# Patient Record
Sex: Male | Born: 1980 | Race: Black or African American | Hispanic: No | Marital: Married | Smoking: Current every day smoker
Health system: Southern US, Community
[De-identification: ages and names within clinical notes are randomized; demographics above are authoritative.]

## PROBLEM LIST (undated history)

## (undated) DIAGNOSIS — K219 Gastro-esophageal reflux disease without esophagitis: Secondary | ICD-10-CM

## (undated) DIAGNOSIS — I10 Essential (primary) hypertension: Secondary | ICD-10-CM

## (undated) HISTORY — PX: APPENDECTOMY: SHX54

---
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
## 1999-06-15 ENCOUNTER — Emergency Department (HOSPITAL_COMMUNITY): Admission: EM | Admit: 1999-06-15 | Discharge: 1999-06-15 | Payer: Self-pay | Admitting: Emergency Medicine

## 2011-09-06 ENCOUNTER — Encounter (HOSPITAL_COMMUNITY): Payer: Self-pay | Admitting: *Deleted

## 2011-09-06 ENCOUNTER — Emergency Department (HOSPITAL_COMMUNITY)
Admission: EM | Admit: 2011-09-06 | Discharge: 2011-09-06 | Discharge status: Against Medical Advice | Payer: 59 | Attending: Emergency Medicine | Admitting: Emergency Medicine

## 2011-09-06 DIAGNOSIS — R110 Nausea: Secondary | ICD-10-CM | POA: Insufficient documentation

## 2011-09-06 DIAGNOSIS — R109 Unspecified abdominal pain: Secondary | ICD-10-CM | POA: Insufficient documentation

## 2011-09-06 HISTORY — DX: Gastro-esophageal reflux disease without esophagitis: K21.9

## 2011-09-06 NOTE — ED Notes (Signed)
Pt in c/o left flank pain with nausea since Sunday, pain increased today

## 2011-09-06 NOTE — ED Notes (Signed)
Pt called x 2, outside checked, no answer.

## 2011-11-21 ENCOUNTER — Emergency Department (HOSPITAL_COMMUNITY): Payer: 59

## 2011-11-21 ENCOUNTER — Encounter (HOSPITAL_COMMUNITY): Payer: Self-pay | Admitting: Emergency Medicine

## 2011-11-21 ENCOUNTER — Emergency Department (HOSPITAL_COMMUNITY)
Admission: EM | Admit: 2011-11-21 | Discharge: 2011-11-21 | Disposition: A | Discharge status: Home / Self Care | Payer: 59 | Attending: Emergency Medicine | Admitting: Emergency Medicine

## 2011-11-21 DIAGNOSIS — R0602 Shortness of breath: Secondary | ICD-10-CM | POA: Insufficient documentation

## 2011-11-21 DIAGNOSIS — M79609 Pain in unspecified limb: Secondary | ICD-10-CM | POA: Insufficient documentation

## 2011-11-21 DIAGNOSIS — M7989 Other specified soft tissue disorders: Secondary | ICD-10-CM | POA: Insufficient documentation

## 2011-11-21 DIAGNOSIS — K219 Gastro-esophageal reflux disease without esophagitis: Secondary | ICD-10-CM | POA: Insufficient documentation

## 2011-11-21 DIAGNOSIS — L089 Local infection of the skin and subcutaneous tissue, unspecified: Secondary | ICD-10-CM

## 2011-11-21 DIAGNOSIS — M79606 Pain in leg, unspecified: Secondary | ICD-10-CM

## 2011-11-21 LAB — COMPREHENSIVE METABOLIC PANEL
Albumin: 3.8 g/dL (ref 3.5–5.2)
Alkaline Phosphatase: 82 U/L (ref 39–117)
BUN: 12 mg/dL (ref 6–23)
CO2: 25 mEq/L (ref 19–32)
Chloride: 102 mEq/L (ref 96–112)
GFR calc non Af Amer: >90 mL/min (ref >90)
Glucose, Bld: 93 mg/dL (ref 70–99)
Potassium: 4.3 mEq/L (ref 3.5–5.1)
Total Bilirubin: 0.7 mg/dL (ref 0.3–1.2)

## 2011-11-21 LAB — CBC
HCT: 41.5 % (ref 39.0–52.0)
MCHC: 33 g/dL (ref 30.0–36.0)
MCV: 84.3 fL (ref 78.0–100.0)
Platelets: 296 10*3/uL (ref 150–400)
RDW: 14.1 % (ref 11.5–15.5)

## 2011-11-21 LAB — DIFFERENTIAL
Basophils Absolute: 0 10*3/uL (ref 0.0–0.1)
Basophils Relative: 0 % (ref 0–1)
Eosinophils Absolute: 0.3 10*3/uL (ref 0.0–0.7)
Eosinophils Relative: 2 % (ref 0–5)
Monocytes Absolute: 1.2 10*3/uL — ABNORMAL HIGH (ref 0.1–1.0)

## 2011-11-21 MED ORDER — DOXYCYCLINE HYCLATE 100 MG PO CAPS: 100.0000 mg | ORAL_CAPSULE | Freq: Two times a day (BID) | ORAL | Status: AC

## 2011-11-21 MED ORDER — TRAMADOL HCL 50 MG PO TABS: 50.0000 mg | ORAL_TABLET | Freq: Four times a day (QID) | ORAL | Status: AC | PRN

## 2011-11-21 MED ORDER — FUROSEMIDE 20 MG PO TABS: 20.0000 mg | ORAL_TABLET | Freq: Two times a day (BID) | ORAL | Status: DC

## 2011-11-21 NOTE — Progress Notes (Signed)
VASCULAR LAB PRELIMINARY  PRELIMINARY  PRELIMINARY  PRELIMINARY  Left lower extremity venous duplex completed.    Preliminary report:  Left:  No evidence of DVT, superficial thrombosis, or Baker's cyst.  Lilia Letterman D, RVS 11/21/2011, 10:13 AM

## 2011-11-21 NOTE — Discharge Instructions (Signed)
Follow up with your md in 2 days °

## 2011-11-21 NOTE — ED Notes (Signed)
bilat leg swelling -- pitting edema, started yesterday, shortness of breath on ambulation. No history of heart problems,

## 2011-11-21 NOTE — ED Provider Notes (Cosign Needed)
History     CSN: [id number]  Arrival date & time 11/21/11  4098   First MD Initiated Contact with Patient 11/21/11 [phone]      Chief Complaint  Patient presents with  . Leg Swelling  . Shortness of Breath    (Consider location/radiation/quality/duration/timing/severity/associated sxs/prior treatment) Patient is a 31 y.o. male presenting with leg pain. The history is provided by the patient (pt complains of swelling in legs and skin infection on abdomen). No language interpreter was used.  Leg Pain  The incident occurred yesterday. The incident occurred at home. There was no injury mechanism. The pain location is generalized. The quality of the pain is described as aching. The pain is at a severity of 1/10. The pain is mild. The pain has been constant since onset. Pertinent negatives include no numbness and no inability to bear weight. He reports no foreign bodies present. The symptoms are aggravated by nothing. He has tried nothing for the symptoms. The treatment provided mild relief.    Past Medical History  Diagnosis Date  . GERD (gastroesophageal reflux disease)     Past Surgical History  Procedure Date  . Appendectomy     History reviewed. No pertinent family history.  History  Substance Use Topics  . Smoking status: Current Everyday Smoker -- 0.5 packs/day  . Smokeless tobacco: Not on file  . Alcohol Use: Yes     socially      Review of Systems  Constitutional: Negative for fatigue.  HENT: Negative for congestion, sinus pressure and ear discharge.   Eyes: Negative for discharge.  Respiratory: Negative for cough.   Cardiovascular: Negative for chest pain.  Gastrointestinal: Negative for abdominal pain and diarrhea.  Genitourinary: Negative for frequency and hematuria.  Musculoskeletal: Negative for back pain.  Skin: Positive for rash.  Neurological: Negative for seizures, numbness and headaches.  Hematological: Negative.   Psychiatric/Behavioral: Negative for  hallucinations.    Allergies  Amoxicillin-pot clavulanate  Home Medications   Current Outpatient Rx  Name Route Sig Dispense Refill  . OMEPRAZOLE 20 MG PO CPDR Oral Take 20 mg by mouth daily.    Marland Kitchen DOXYCYCLINE HYCLATE 100 MG PO CAPS Oral Take 1 capsule (100 mg total) by mouth 2 (two) times daily. 20 capsule 0  . FUROSEMIDE 20 MG PO TABS Oral Take 1 tablet (20 mg total) by mouth 2 (two) times daily. 3 tablet 0  . TRAMADOL HCL 50 MG PO TABS Oral Take 1 tablet (50 mg total) by mouth every 6 (six) hours as needed for pain. 15 tablet 0    BP 138/54  Pulse 69  Temp(Src) 97.9 F (36.6 C) (Oral)  Resp 20  SpO2 98%  Physical Exam  Constitutional: He is oriented to person, place, and time. He appears well-developed.  HENT:  Head: Normocephalic and atraumatic.  Eyes: Conjunctivae and EOM are normal. No scleral icterus.  Neck: Neck supple. No thyromegaly present.  Cardiovascular: Normal rate and regular rhythm.  Exam reveals no gallop and no friction rub.   No murmur heard. Pulmonary/Chest: No stridor. He has no wheezes. He has no rales. He exhibits no tenderness.  Abdominal: He exhibits no distension. There is no tenderness. There is no rebound.  Musculoskeletal: Normal range of motion. He exhibits edema.       Edema both legs  Lymphadenopathy:    He has no cervical adenopathy.  Neurological: He is oriented to person, place, and time. Coordination normal.  Skin: Rash noted. No erythema.  Skin infection to abdomen  Psychiatric: He has a normal mood and affect. His behavior is normal.    ED Course  Procedures (including critical care time)  Labs Reviewed  CBC - Abnormal; Notable for the following:    WBC 11.9 (*)    All other components within normal limits  DIFFERENTIAL - Abnormal; Notable for the following:    Monocytes Absolute 1.2 (*)    All other components within normal limits  CARDIAC PANEL(CRET KIN+CKTOT+MB+TROPI) - Abnormal; Notable for the following:    Total CK  418 (*)    All other components within normal limits  PRO B NATRIURETIC PEPTIDE  COMPREHENSIVE METABOLIC PANEL   Dg Chest 2 View  11/21/2011  *RADIOLOGY REPORT*  Clinical Data: Shortness of breath.  CHEST - 2 VIEW  Comparison: None  Findings: The cardiac silhouette, mediastinal and hilar contours are within normal limits.  The lungs are clear.  No pleural effusion.  The bony thorax is intact.  IMPRESSION: No acute cardiopulmonary findings.  Original Report Authenticated By: P. Loralie Champagne, M.D.     1. Skin infection   2. Leg pain    EKG: normal EKG, normal sinus rhythm, rate 67.  2  MDM          Benny Lennert, MD 11/21/11 1440

## 2012-09-15 ENCOUNTER — Encounter (HOSPITAL_COMMUNITY): Payer: Self-pay

## 2012-09-15 ENCOUNTER — Emergency Department (HOSPITAL_COMMUNITY): Payer: 59

## 2012-09-15 ENCOUNTER — Emergency Department (HOSPITAL_COMMUNITY)
Admission: EM | Admit: 2012-09-15 | Discharge: 2012-09-15 | Disposition: A | Discharge status: Home Health | Payer: 59 | Attending: Emergency Medicine | Admitting: Emergency Medicine

## 2012-09-15 DIAGNOSIS — R0602 Shortness of breath: Secondary | ICD-10-CM | POA: Insufficient documentation

## 2012-09-15 DIAGNOSIS — R079 Chest pain, unspecified: Secondary | ICD-10-CM | POA: Insufficient documentation

## 2012-09-15 DIAGNOSIS — K219 Gastro-esophageal reflux disease without esophagitis: Secondary | ICD-10-CM | POA: Insufficient documentation

## 2012-09-15 DIAGNOSIS — F17200 Nicotine dependence, unspecified, uncomplicated: Secondary | ICD-10-CM | POA: Insufficient documentation

## 2012-09-15 LAB — BASIC METABOLIC PANEL
Calcium: 9.5 mg/dL (ref 8.4–10.5)
GFR calc Af Amer: 89 mL/min — ABNORMAL LOW (ref >90)
GFR calc non Af Amer: 76 mL/min — ABNORMAL LOW (ref >90)
Glucose, Bld: 104 mg/dL — ABNORMAL HIGH (ref 70–99)
Sodium: 138 mEq/L (ref 135–145)

## 2012-09-15 LAB — POCT I-STAT TROPONIN I
Troponin i, poc: 0 ng/mL (ref 0.00–0.08)
Troponin i, poc: 0.01 ng/mL (ref 0.00–0.08)

## 2012-09-15 NOTE — ED Notes (Signed)
Pt has hx of GERD, states he forgot to rx this AM. States "I felt like I had to burp, and had pain in the center of my chest. My left arm started tingling." Pain resolved after vomiting. Denies nausea/CP at this time.

## 2012-09-15 NOTE — ED Provider Notes (Addendum)
History     CSN: [id number]  Arrival date & time 09/15/12  7829   First MD Initiated Contact with Patient 09/15/12 [phone]      Chief Complaint  Patient presents with  . Chest Pain    (Consider location/radiation/quality/duration/timing/severity/associated sxs/prior treatment) HPI This is a 32 year old male with a history of GERD. He developed moderate to severe substernal pain about 11 PM yesterday evening. He states the pain felt like "bad gas" and that he felt like he "needed to burp". He states he did feel short of breath at that time but attributes this to hyperventilation due to the pain making him very anxious (and EMS reports patient was hyperventilating on their arrival). He was not nauseated. He was not diaphoretic. After about 45 minutes he vomited 3 times with complete relief of his symptoms. He has not had the symptoms return. He was given aspirin by EMS prior to arrival. He has no family history of early coronary artery disease. He has a personal history of heart disease, hypertension or diabetes. He does smoke.  Past Medical History  Diagnosis Date  . GERD (gastroesophageal reflux disease)     Past Surgical History  Procedure Laterality Date  . Appendectomy      History reviewed. No pertinent family history.  History  Substance Use Topics  . Smoking status: Current Every Day Smoker -- 0.50 packs/day  . Smokeless tobacco: Not on file  . Alcohol Use: Yes     Comment: socially      Review of Systems  All other systems reviewed and are negative.    Allergies  Amoxicillin-pot clavulanate  Home Medications   Current Outpatient Rx  Name  Route  Sig  Dispense  Refill  . omeprazole (PRILOSEC) 20 MG capsule   Oral   Take 20 mg by mouth daily.           BP 119/58  Pulse 72  Temp(Src) 98.2 F (36.8 C) (Oral)  Resp 17  SpO2 96%  Physical Exam General: Well-developed, well-nourished male in no acute distress; appearance consistent with age of  record HENT: normocephalic, atraumatic Eyes: pupils equal round and reactive to light; extraocular muscles intact Neck: supple Heart: regular rate and rhythm; no murmurs, rubs or gallops Lungs: clear to auscultation bilaterally Abdomen: soft; nondistended; nontender; bowel sounds present Extremities: No deformity; full range of motion; pulses normal Neurologic: Awake, alert and oriented; motor function intact in all extremities and symmetric; no facial droop Skin: Warm and dry Psychiatric: Normal mood and affect    ED Course  Procedures (including critical care time)     MDM   Nursing notes and vitals signs, including pulse oximetry, reviewed.  Summary of this visit's results, reviewed by myself:  Labs:  Results for orders placed during the hospital encounter of 09/15/12 (from the past 24 hour(s))  BASIC METABOLIC PANEL     Status: Abnormal   Collection Time    09/15/12  1:01 AM      Result Value Range   Sodium 138  135 - 145 mEq/L   Potassium 4.2  3.5 - 5.1 mEq/L   Chloride 101  96 - 112 mEq/L   CO2 28  19 - 32 mEq/L   Glucose, Bld 104 (*) 70 - 99 mg/dL   BUN 16  6 - 23 mg/dL   Creatinine, Ser 3.08  0.50 - 1.35 mg/dL   Calcium 9.5  8.4 - 65.7 mg/dL   GFR calc non Af Amer 76 (*) >90 mL/min  GFR calc Af Amer 89 (*) >90 mL/min  POCT I-STAT TROPONIN I     Status: None   Collection Time    09/15/12  1:22 AM      Result Value Range   Troponin i, poc 0.00  0.00 - 0.08 ng/mL   Comment 3           POCT I-STAT TROPONIN I     Status: None   Collection Time    09/15/12  5:08 AM      Result Value Range   Troponin i, poc 0.01  0.00 - 0.08 ng/mL   Comment 3             Imaging Studies: Dg Chest 2 View  09/15/2012  *RADIOLOGY REPORT*  Clinical Data: Chest pain and shortness of breath from earlier tonight.  Smoker for 10 years.  CHEST - 2 VIEW  Comparison: 11/21/2011  Findings: Slightly shallow inspiration.  Normal heart size and pulmonary vascularity.  Slight fibrosis in  the left lung base is stable.  No focal consolidation or airspace disease.  No blunting of costophrenic angles.  No pneumothorax.  Mediastinal contours appear intact.  No significant change since previous study.  IMPRESSION: No evidence of active pulmonary disease.   Original Report Authenticated By: Burman Nieves, M.D.     EKG Interpretation:  Date & Time: 09/15/2012 12:52 AM  Rate: 84  Rhythm: normal sinus rhythm  QRS Axis: normal  Intervals: normal  ST/T Wave abnormalities: T wave inversions in III and aVF  Conduction Disutrbances:none  Narrative Interpretation:   Old EKG Reviewed: Unchanged except T wave inversion in aVF not seen previously  5:31 AM Patient is young and otherwise healthy. His only risk factor is smoking. His story sounds atypical for cardiac origin, especially given his relief with emesis. We will refer him back to his primary care physician.        Hanley Seamen, MD 09/15/12 0530  Hanley Seamen, MD 09/15/12 8413

## 2012-09-15 NOTE — ED Notes (Signed)
Per EMS: Pt from home c/o midsternal CP radiating to left arm starting at 2300. Pt reports 3 episodes of vomiting, diaphoresis, SOB. Pt hyperventilating on EMS arrival. EKG unremarkable. 90 NSR. 140/70. 324 aspirin. Pt denies CP at this time. AO x 4.

## 2013-07-23 ENCOUNTER — Emergency Department (HOSPITAL_COMMUNITY)
Admission: EM | Admit: 2013-07-23 | Discharge: 2013-07-23 | Disposition: A | Discharge status: Home / Self Care | Payer: 59 | Attending: Emergency Medicine | Admitting: Emergency Medicine

## 2013-07-23 ENCOUNTER — Encounter (HOSPITAL_COMMUNITY): Payer: Self-pay | Admitting: Emergency Medicine

## 2013-07-23 DIAGNOSIS — L03221 Cellulitis of neck: Principal | ICD-10-CM

## 2013-07-23 DIAGNOSIS — R05 Cough: Secondary | ICD-10-CM | POA: Insufficient documentation

## 2013-07-23 DIAGNOSIS — F17200 Nicotine dependence, unspecified, uncomplicated: Secondary | ICD-10-CM | POA: Insufficient documentation

## 2013-07-23 DIAGNOSIS — R059 Cough, unspecified: Secondary | ICD-10-CM | POA: Insufficient documentation

## 2013-07-23 DIAGNOSIS — L0211 Cutaneous abscess of neck: Secondary | ICD-10-CM

## 2013-07-23 DIAGNOSIS — K219 Gastro-esophageal reflux disease without esophagitis: Secondary | ICD-10-CM | POA: Insufficient documentation

## 2013-07-23 DIAGNOSIS — Z79899 Other long term (current) drug therapy: Secondary | ICD-10-CM | POA: Insufficient documentation

## 2013-07-23 DIAGNOSIS — R42 Dizziness and giddiness: Secondary | ICD-10-CM | POA: Insufficient documentation

## 2013-07-23 MED ORDER — LIDOCAINE-EPINEPHRINE 2 %-1:100000 IJ SOLN
20.0000 mL | Freq: Once | INTRAMUSCULAR | Status: AC
  Administered 2013-07-23: 20 mL

## 2013-07-23 NOTE — ED Provider Notes (Signed)
CSN: [id number]     Arrival date & time 07/23/13  1529 History  This chart was scribed for non-physician practitioner Trixie Dredge, PA-C working with Shanna Cisco, MD by Leone Payor, ED Scribe. This patient was seen in room WTR8/WTR8 and the patient's care was started at 1529.    Chief Complaint  Patient presents with  . knot on neck     The history is provided by the patient. No language interpreter was used.    HPI Comments: Edward Osborn is a 33 y.o. male who presents to the Emergency Department complaining of 4 weeks of gradual onset, gradually worsening, constant abscess to the right posterior neck. He states the area drained some blood 2 weeks ago but did not notice pus. He reports having surrounding pain and posterior head pain. He has applied warm compresses with mild relief. He reports having a subjective fever last week that was associated with a URI that is resolving with exception of residual cough. He also reports having occasional lightheadedness. He denies nausea, vomiting, recent fevers, body aches.   Past Medical History  Diagnosis Date  . GERD (gastroesophageal reflux disease)    Past Surgical History  Procedure Laterality Date  . Appendectomy     No family history on file. History  Substance Use Topics  . Smoking status: Current Every Day Smoker -- 0.50 packs/day  . Smokeless tobacco: Not on file  . Alcohol Use: Yes     Comment: socially    Review of Systems  Constitutional: Negative for fever.  Respiratory: Positive for cough. Negative for shortness of breath.   Cardiovascular: Negative for chest pain.  Musculoskeletal: Negative for neck stiffness.  Skin: Positive for wound (abscess). Negative for color change.  Allergic/Immunologic: Negative for immunocompromised state.  Neurological: Positive for light-headedness.  Hematological: Does not bruise/bleed easily.    Allergies  Amoxicillin-pot clavulanate  Home Medications   Current Outpatient Rx   Name  Route  Sig  Dispense  Refill  . omeprazole (PRILOSEC) 40 MG capsule   Oral   Take 40 mg by mouth daily.          BP 151/90  Pulse 68  Temp(Src) 98.1 F (36.7 C) (Oral)  Resp 16  SpO2 98% Physical Exam  Nursing note and vitals reviewed. Constitutional: He appears well-developed and well-nourished. No distress.  HENT:  Head: Normocephalic and atraumatic.  Neck: Neck supple.  Cardiovascular: Normal rate, regular rhythm and normal heart sounds.   Pulmonary/Chest: Effort normal and breath sounds normal. No respiratory distress. He has no wheezes. He has no rales. He exhibits tenderness (over left lateral chest wall).  Neurological: He is alert.  Skin: He is not diaphoretic.  Superficial, raised, round, tender area of fluctuance over the right posterior neck.     ED Course  Procedures (including critical care time)  DIAGNOSTIC STUDIES: Oxygen Saturation is 98% on RA, normal by my interpretation.    COORDINATION OF CARE: 5:16 PM Will perform I&D. Discussed treatment plan with pt at bedside and pt agreed to plan.   Labs Review Labs Reviewed - No data to display Imaging Review No results found.  INCISION AND DRAINAGE Performed by: Trixie Dredge Consent: Verbal consent obtained. Risks and benefits: risks, benefits and alternatives were discussed Type: abscess  Body area: right posterior neck, superficial  Anesthesia: local infiltration  Incision was made with a scalpel.  Local anesthetic: lidocaine 2% no epinephrine  Anesthetic total: 2.5 ml  Complexity: complex Blunt dissection very superficially to break  up loculations  Drainage: purulent  Drainage amount: large  Packing material: no  Patient tolerance: Patient tolerated the procedure well with no immediate complications.   Patient is aware that he will have a scar from this procedure.   EKG Interpretation   None       MDM   1. Neck abscess   Afebrile nontoxic patient with right neck  abscess, very superficial.  I&D in ED with large amount of purulent discharge.  Pt reported immediate relief after I&D. No overlying cellulitis.  D/C home.  Discussed findings, treatment, and follow up  with patient.  Pt given return precautions.  Pt verbalizes understanding and agrees with plan.       I personally performed the services described in this documentation, which was scribed in my presence. The recorded information has been reviewed and is accurate.   Trixie Dredgemily Justyne Roell, PA-C 07/23/13 1810

## 2013-07-23 NOTE — ED Notes (Signed)
Pt has knot to right side of neck x 2 wks; draining two wks ago; neck pain

## 2013-07-23 NOTE — Discharge Instructions (Signed)
Read the information below.  You may return to the Emergency Department at any time for worsening condition or any new symptoms that concern you.  If you develop redness, swelling, increased pus draining from the wound, or fevers greater than 100.4, return to the ER immediately for a recheck.     Abscess An abscess is an infected area that contains a collection of pus and debris.It can occur in almost any part of the body. An abscess is also known as a furuncle or boil. CAUSES  An abscess occurs when tissue gets infected. This can occur from blockage of oil or sweat glands, infection of hair follicles, or a minor injury to the skin. As the body tries to fight the infection, pus collects in the area and creates pressure under the skin. This pressure causes pain. People with weakened immune systems have difficulty fighting infections and get certain abscesses more often.  SYMPTOMS Usually an abscess develops on the skin and becomes a painful mass that is red, warm, and tender. If the abscess forms under the skin, you may feel a moveable soft area under the skin. Some abscesses break open (rupture) on their own, but most will continue to get worse without care. The infection can spread deeper into the body and eventually into the bloodstream, causing you to feel ill.  DIAGNOSIS  Your caregiver will take your medical history and perform a physical exam. A sample of fluid may also be taken from the abscess to determine what is causing your infection. TREATMENT  Your caregiver may prescribe antibiotic medicines to fight the infection. However, taking antibiotics alone usually does not cure an abscess. Your caregiver may need to make a small cut (incision) in the abscess to drain the pus. In some cases, gauze is packed into the abscess to reduce pain and to continue draining the area. HOME CARE INSTRUCTIONS   Only take over-the-counter or prescription medicines for pain, discomfort, or fever as directed by  your caregiver.  If you were prescribed antibiotics, take them as directed. Finish them even if you start to feel better.  If gauze is used, follow your caregiver's directions for changing the gauze.  To avoid spreading the infection:  Keep your draining abscess covered with a bandage.  Wash your hands well.  Do not share personal care items, towels, or whirlpools with others.  Avoid skin contact with others.  Keep your skin and clothes clean around the abscess.  Keep all follow-up appointments as directed by your caregiver. SEEK MEDICAL CARE IF:   You have increased pain, swelling, redness, fluid drainage, or bleeding.  You have muscle aches, chills, or a general ill feeling.  You have a fever. MAKE SURE YOU:   Understand these instructions.  Will watch your condition.  Will get help right away if you are not doing well or get worse. Document Released: 03/30/2005 Document Revised: 12/20/2011 Document Reviewed: 09/02/2011 [facility] Patient Information 2014 [place], [place].  Abscess Care After An abscess (also called a boil or furuncle) is an infected area that contains a collection of pus. Signs and symptoms of an abscess include pain, tenderness, redness, or hardness, or you may feel a moveable soft area under your skin. An abscess can occur anywhere in the body. The infection may spread to surrounding tissues causing cellulitis. A cut (incision) by the surgeon was made over your abscess and the pus was drained out. Gauze may have been packed into the space to provide a drain that will allow the  cavity to heal from the inside outwards. The boil may be painful for 5 to 7 days. Most people with a boil do not have high fevers. Your abscess, if seen early, may not have localized, and may not have been lanced. If not, another appointment may be required for this if it does not get better on its own or with medications. HOME CARE INSTRUCTIONS   Only take over-the-counter or  prescription medicines for pain, discomfort, or fever as directed by your caregiver.  When you bathe, soak and then remove gauze or iodoform packs at least daily or as directed by your caregiver. You may then wash the wound gently with mild soapy water. Repack with gauze or do as your caregiver directs. SEEK IMMEDIATE MEDICAL CARE IF:   You develop increased pain, swelling, redness, drainage, or bleeding in the wound site.  You develop signs of generalized infection including muscle aches, chills, fever, or a general ill feeling.  An oral temperature above 102 F (38.9 C) develops, not controlled by medication. See your caregiver for a recheck if you develop any of the symptoms described above. If medications (antibiotics) were prescribed, take them as directed. Document Released: 01/06/2005 Document Revised: 09/12/2011 Document Reviewed: 09/03/2007 [facility] Patient Information 2014 [place], [place].

## 2013-07-24 NOTE — ED Provider Notes (Signed)
Medical screening examination/treatment/procedure(s) were performed by non-physician practitioner and as supervising physician I was immediately available for consultation/collaboration.  Darlyn Repsher E Leslieann Whisman, MD 07/24/13 1200 

## 2015-12-13 ENCOUNTER — Encounter (HOSPITAL_COMMUNITY): Payer: Self-pay

## 2015-12-13 ENCOUNTER — Emergency Department (HOSPITAL_COMMUNITY): Payer: Managed Care, Other (non HMO)

## 2015-12-13 ENCOUNTER — Emergency Department (HOSPITAL_COMMUNITY)
Admission: EM | Admit: 2015-12-13 | Discharge: 2015-12-13 | Disposition: A | Discharge status: Home / Self Care | Payer: Managed Care, Other (non HMO) | Attending: Emergency Medicine | Admitting: Emergency Medicine

## 2015-12-13 DIAGNOSIS — S61512A Laceration without foreign body of left wrist, initial encounter: Secondary | ICD-10-CM | POA: Diagnosis present

## 2015-12-13 DIAGNOSIS — Y999 Unspecified external cause status: Secondary | ICD-10-CM | POA: Insufficient documentation

## 2015-12-13 DIAGNOSIS — Y929 Unspecified place or not applicable: Secondary | ICD-10-CM | POA: Insufficient documentation

## 2015-12-13 DIAGNOSIS — Y939 Activity, unspecified: Secondary | ICD-10-CM | POA: Diagnosis not present

## 2015-12-13 DIAGNOSIS — F17200 Nicotine dependence, unspecified, uncomplicated: Secondary | ICD-10-CM | POA: Insufficient documentation

## 2015-12-13 DIAGNOSIS — W25XXXA Contact with sharp glass, initial encounter: Secondary | ICD-10-CM | POA: Insufficient documentation

## 2015-12-13 LAB — CBC
HCT: 42.7 % (ref 39.0–52.0)
HEMOGLOBIN: 13.6 g/dL (ref 13.0–17.0)
MCH: 27.3 pg (ref 26.0–34.0)
MCHC: 31.9 g/dL (ref 30.0–36.0)
MCV: 85.6 fL (ref 78.0–100.0)
PLATELETS: 302 10*3/uL (ref 150–400)
RBC: 4.99 MIL/uL (ref 4.22–5.81)
RDW: 13.9 % (ref 11.5–15.5)
WBC: 12.6 10*3/uL — AB (ref 4.0–10.5)

## 2015-12-13 MED ORDER — NAPROXEN 500 MG PO TABS: 500.0000 mg | ORAL_TABLET | Freq: Two times a day (BID) | ORAL | Status: DC

## 2015-12-13 MED ORDER — ONDANSETRON HCL 4 MG/2ML IJ SOLN
4.0000 mg | Freq: Once | INTRAMUSCULAR | Status: AC
  Administered 2015-12-13: 4 mg via INTRAVENOUS
  Filled 2015-12-13: qty 2

## 2015-12-13 MED ORDER — LIDOCAINE HCL 2 % IJ SOLN
10.0000 mL | Freq: Once | INTRAMUSCULAR | Status: DC
  Filled 2015-12-13: qty 20

## 2015-12-13 MED ORDER — MORPHINE SULFATE (PF) 4 MG/ML IV SOLN
4.0000 mg | Freq: Once | INTRAVENOUS | Status: AC
  Administered 2015-12-13: 4 mg via INTRAVENOUS
  Filled 2015-12-13: qty 1

## 2015-12-13 MED ORDER — LIDOCAINE-EPINEPHRINE 1 %-1:100000 IJ SOLN
30.0000 mL | Freq: Once | INTRAMUSCULAR | Status: AC
  Administered 2015-12-13: 1 mL via INTRADERMAL
  Filled 2015-12-13 (×2): qty 1

## 2015-12-13 NOTE — Discharge Instructions (Signed)
Laceration Care, Adult  A laceration is a cut that goes through all layers of the skin. The cut also goes into the tissue that is right under the skin. Some cuts heal on their own. Others need to be closed with stitches (sutures), staples, skin adhesive strips, or wound glue. Taking care of your cut lowers your risk of infection and helps your cut to heal better.  HOW TO TAKE CARE OF YOUR CUT  For stitches or staples:  · Keep the wound clean and dry.  · If you were given a bandage (dressing), you should change it at least one time per day or as told by your doctor. You should also change it if it gets wet or dirty.  · Keep the wound completely dry for the first 24 hours or as told by your doctor. After that time, you may take a shower or a bath. However, make sure that the wound is not soaked in water until after the stitches or staples have been removed.  · Clean the wound one time each day or as told by your doctor:    Wash the wound with soap and water.    Rinse the wound with water until all of the soap comes off.    Pat the wound dry with a clean towel. Do not rub the wound.  · After you clean the wound, put a thin layer of antibiotic ointment on it as told by your doctor. This ointment:    Helps to prevent infection.    Keeps the bandage from sticking to the wound.  · Have your stitches or staples removed as told by your doctor.  If your doctor used skin adhesive strips:   · Keep the wound clean and dry.  · If you were given a bandage, you should change it at least one time per day or as told by your doctor. You should also change it if it gets dirty or wet.  · Do not get the skin adhesive strips wet. You can take a shower or a bath, but be careful to keep the wound dry.  · If the wound gets wet, pat it dry with a clean towel. Do not rub the wound.  · Skin adhesive strips fall off on their own. You can trim the strips as the wound heals. Do not remove any strips that are still stuck to the wound. They will  fall off after a while.  If your doctor used wound glue:  · Try to keep your wound dry, but you may briefly wet it in the shower or bath. Do not soak the wound in water, such as by swimming.  · After you take a shower or a bath, gently pat the wound dry with a clean towel. Do not rub the wound.  · Do not do any activities that will make you really sweaty until the skin glue has fallen off on its own.  · Do not apply liquid, cream, or ointment medicine to your wound while the skin glue is still on.  · If you were given a bandage, you should change it at least one time per day or as told by your doctor. You should also change it if it gets dirty or wet.  · If a bandage is placed over the wound, do not let the tape for the bandage touch the skin glue.  · Do not pick at the glue. The skin glue usually stays on for 5-10 days. Then, it   falls off of the skin.  General Instructions   · To help prevent scarring, make sure to cover your wound with sunscreen whenever you are outside after stitches are removed, after adhesive strips are removed, or when wound glue stays in place and the wound is healed. Make sure to wear a sunscreen of at least 30 SPF.  · Take over-the-counter and prescription medicines only as told by your doctor.  · If you were given antibiotic medicine or ointment, take or apply it as told by your doctor. Do not stop using the antibiotic even if your wound is getting better.  · Do not scratch or pick at the wound.  · Keep all follow-up visits as told by your doctor. This is important.  · Check your wound every day for signs of infection. Watch for:    Redness, swelling, or pain.    Fluid, blood, or pus.  · Raise (elevate) the injured area above the level of your heart while you are sitting or lying down, if possible.  GET HELP IF:  · You got a tetanus shot and you have any of these problems at the injection site:    Swelling.    Very bad pain.    Redness.    Bleeding.  · You have a fever.  · A wound that was  closed breaks open.  · You notice a bad smell coming from your wound or your bandage.  · You notice something coming out of the wound, such as wood or glass.  · Medicine does not help your pain.  · You have more redness, swelling, or pain at the site of your wound.  · You have fluid, blood, or pus coming from your wound.  · You notice a change in the color of your skin near your wound.  · You need to change the bandage often because fluid, blood, or pus is coming from the wound.  · You start to have a new rash.  · You start to have numbness around the wound.  GET HELP RIGHT AWAY IF:  · You have very bad swelling around the wound.  · Your pain suddenly gets worse and is very bad.  · You notice painful lumps near the wound or on skin that is anywhere on your body.  · You have a red streak going away from your wound.  · The wound is on your hand or foot and you cannot move a finger or toe like you usually can.  · The wound is on your hand or foot and you notice that your fingers or toes look pale or bluish.     This information is not intended to replace advice given to you by your health care provider. Make sure you discuss any questions you have with your health care provider.     Document Released: 12/07/2007 Document Revised: 11/04/2014 Document Reviewed: 06/16/2014  Elsevier Interactive Patient Education ©2016 [employer]

## 2015-12-13 NOTE — ED Provider Notes (Signed)
CSN: [id number]     Arrival date & time 12/13/15  1216 History   First MD Initiated Contact with Patient 12/13/15 1220     Chief Complaint  Patient presents with  . Laceration   HPI Patient presents to the emergency room with complaints of a left wrist laceration. Patient slammed his wrist down on his stove top. The patient's stovetop is made of glass and the glass shattered. Patient sustained a laceration to the ulnar side of his left wrist. Patient started bleeding heavily. He wrapped a T-shirt around the wrist but it kept on bleeding.  The patient presents to the emergency room for evaluation. He complains of some numbness now involving all his fingers. No syncope. Past Medical History  Diagnosis Date  . GERD (gastroesophageal reflux disease)    Past Surgical History  Procedure Laterality Date  . Appendectomy     No family history on file. Social History  Substance Use Topics  . Smoking status: Current Every Day Smoker -- 0.50 packs/day  . Smokeless tobacco: None  . Alcohol Use: Yes     Comment: socially    Review of Systems  All other systems reviewed and are negative.     Allergies  Amoxicillin-pot clavulanate  Home Medications   Prior to Admission medications   Medication Sig Start Date End Date Taking? Authorizing Provider  omeprazole (PRILOSEC) 40 MG capsule Take 40 mg by mouth daily.   Yes Historical Provider, MD  naproxen (NAPROSYN) 500 MG tablet Take 1 tablet (500 mg total) by mouth 2 (two) times daily. 12/13/15   Linwood Dibbles, MD   BP 156/106 mmHg  Pulse 96  Resp 14  Ht  (1.727 m)  Wt 102.059 kg  BMI 34.22 kg/m2  SpO2 96% Physical Exam  Constitutional: He appears well-developed and well-nourished. No distress.  HENT:  Head: Normocephalic and atraumatic.  Right Ear: External ear normal.  Left Ear: External ear normal.  Eyes: Conjunctivae are normal. Right eye exhibits no discharge. Left eye exhibits no discharge. No scleral icterus.  Neck: Neck  supple. No tracheal deviation present.  Cardiovascular: Normal rate.   Pulmonary/Chest: Effort normal. No stridor. No respiratory distress.  Musculoskeletal: He exhibits tenderness. He exhibits no edema.       Left wrist: He exhibits tenderness and laceration.  approx. 2 cm laceration ulnar aspect of left wrist, distal nv intact,  No active bleeding during my exam but several clots and blood soaked t shirt was removed from around his wrist  Neurological: He is alert. Cranial nerve deficit: no gross deficits.  Skin: Skin is warm and dry. No rash noted.  Psychiatric: He has a normal mood and affect.  Nursing note and vitals reviewed.   ED Course  Procedures (including critical care time) Labs Review Labs Reviewed  CBC - Abnormal; Notable for the following:    WBC 12.6 (*)    All other components within normal limits    Imaging Review Dg Wrist Complete Left  12/13/2015  CLINICAL DATA:  Laceration. EXAM: LEFT WRIST - COMPLETE 3+ VIEW COMPARISON:  None. FINDINGS: The study is limited due to overlapping bandaging. Within visualize factors no fractures, dislocations, or foreign bodies are identified. IMPRESSION: No acute abnormalities.  Study limited due to overlapping bandaging. Electronically Signed   By: Gerome Sam III M.D   On: 12/13/2015 13:53   I have personally reviewed and evaluated these images and lab results as part of my medical decision-making.   MDM   Final diagnoses:  Wrist laceration, left, initial encounter    Laceration repaired by PA Kirichenko.  No foreign body on xray or during exploration of wound.  Counseled pt that small foreign bodies can be missed although overall unlikely.  Will dc home with nsaids.  Suture removal in 10 days    Linwood DibblesJon Colbe Viviano, MD 12/13/15 1459

## 2015-12-13 NOTE — ED Provider Notes (Signed)
LACERATION REPAIR Performed by: Lottie MusselKIRICHENKO, Sherri Mcarthy A Authorized by: Jaynie CrumbleKIRICHENKO, Amora Sheehy A Consent: Verbal consent obtained. Risks and benefits: risks, benefits and alternatives were discussed Consent given by: patient Patient identity confirmed: provided demographic data Prepped and Draped in normal sterile fashion Wound explored  Laceration Location: left forearm  Laceration Length: 4cm  No Foreign Bodies seen or palpated  Anesthesia: local infiltration  Local anesthetic: lidocaine 1% w epinephrine  Anesthetic total: 3 ml  Irrigation method: syringe Amount of cleaning: standard  Skin closure: vicril rapid 5.0, prolene 4.0  Number of sutures: 3 deep, 5 cutaneous  Technique: simple interrupted and 1 figure 8  Patient tolerance: Patient tolerated the procedure well with no immediate complications.   Jaynie Crumbleatyana Kendrick Haapala, PA-C 12/13/15 1459  Linwood DibblesJon Knapp, MD 12/13/15 1500

## 2015-12-13 NOTE — ED Notes (Signed)
Pt. Was hitting top of a glass stove and it broke and lacerated his lt.lateral wrist.  Bleeding noted.

## 2016-03-18 ENCOUNTER — Encounter (HOSPITAL_COMMUNITY): Payer: Self-pay

## 2016-03-18 ENCOUNTER — Emergency Department (HOSPITAL_COMMUNITY)
Admission: EM | Admit: 2016-03-18 | Discharge: 2016-03-18 | Disposition: A | Discharge status: Home / Self Care | Payer: Managed Care, Other (non HMO) | Attending: Emergency Medicine | Admitting: Emergency Medicine

## 2016-03-18 DIAGNOSIS — IMO0001 Reserved for inherently not codable concepts without codable children: Secondary | ICD-10-CM

## 2016-03-18 DIAGNOSIS — Z79899 Other long term (current) drug therapy: Secondary | ICD-10-CM | POA: Insufficient documentation

## 2016-03-18 DIAGNOSIS — I10 Essential (primary) hypertension: Secondary | ICD-10-CM | POA: Insufficient documentation

## 2016-03-18 DIAGNOSIS — R030 Elevated blood-pressure reading, without diagnosis of hypertension: Secondary | ICD-10-CM

## 2016-03-18 DIAGNOSIS — F17200 Nicotine dependence, unspecified, uncomplicated: Secondary | ICD-10-CM | POA: Insufficient documentation

## 2016-03-18 HISTORY — DX: Essential (primary) hypertension: I10

## 2016-03-18 NOTE — ED Provider Notes (Signed)
[facility]-EMERGENCY DEPT Provider Note   CSN: [id number] Arrival date & time: 03/18/16  1338     History   Chief Complaint Chief Complaint  Patient presents with  . Hypertension  . Dizziness    HPI Janan HalterDarrell Celani is a 35 y.o. male.  The history is provided by the patient.  Hypertension  This is a new problem. The current episode started 1 to 2 hours ago. The problem occurs constantly. The problem has been resolved. Associated symptoms include headaches (slight, resolved\). Pertinent negatives include no chest pain, no abdominal pain and no shortness of breath. Nothing aggravates the symptoms. Nothing relieves the symptoms.    Past Medical History:  Diagnosis Date  . GERD (gastroesophageal reflux disease)   . Hypertension     There are no active problems to display for this patient.   Past Surgical History:  Procedure Laterality Date  . APPENDECTOMY         Home Medications    Prior to Admission medications   Medication Sig Start Date End Date Taking? Authorizing Provider  omeprazole (PRILOSEC) 40 MG capsule Take 40 mg by mouth daily.   Yes Historical Provider, MD  naproxen (NAPROSYN) 500 MG tablet Take 1 tablet (500 mg total) by mouth 2 (two) times daily. Patient not taking: Reported on 03/18/2016 12/13/15   Linwood DibblesJon Knapp, MD    Family History Family History  Problem Relation Age of Onset  . Cirrhosis Mother     Social History Social History  Substance Use Topics  . Smoking status: Current Every Day Smoker    Packs/day: 0.50  . Smokeless tobacco: Never Used  . Alcohol use Yes     Comment: socially     Allergies   Amoxicillin-pot clavulanate   Review of Systems Review of Systems  Respiratory: Negative for shortness of breath.   Cardiovascular: Negative for chest pain.  Gastrointestinal: Negative for abdominal pain.  Neurological: Positive for headaches (slight, resolved\).  All other systems reviewed and are negative.    Physical Exam Updated  Vital Signs BP 132/73 (BP Location: Left Arm)   Pulse 81   Temp 98.4 F (36.9 C) (Oral)   Resp 15   Ht 5\' 7"  (1.702 m)   Wt 237 lb (107.5 kg)   SpO2 97%   BMI 37.12 kg/m   Physical Exam  Constitutional: He is oriented to person, place, and time. He appears well-developed and well-nourished. No distress.  HENT:  Head: Normocephalic and atraumatic.  Eyes: Conjunctivae are normal.  Neck: Neck supple. No tracheal deviation present.  Cardiovascular: Normal rate, regular rhythm and normal heart sounds.   Pulmonary/Chest: Effort normal and breath sounds normal. No respiratory distress.  Abdominal: Soft. He exhibits no distension. There is no tenderness.  Neurological: He is alert and oriented to person, place, and time. No cranial nerve deficit. Coordination normal.  Skin: Skin is warm and dry.  Psychiatric: He has a normal mood and affect.  Vitals reviewed.    ED Treatments / Results  Labs (all labs ordered are listed, but only abnormal results are displayed) Labs Reviewed - No data to display  EKG  EKG Interpretation None       Radiology No results found.  Procedures Procedures (including critical care time)  Medications Ordered in ED Medications - No data to display   Initial Impression / Assessment and Plan / ED Course  I have reviewed the triage vital signs and the nursing notes.  Pertinent labs & imaging results that were available during  my care of the patient were reviewed by me and considered in my medical decision making (see chart for details).  Clinical Course   35 y.o. male presents with hypertension at OP PCP office at a checkup. The office attempted to treat his BP with oral clonidine and when it did not go down they sent him here for further evaluation. He became slightly lightheaded after leaving the office but is currently asymptomatic again. I explained to the Pt the need for following blood pressure to reduce the negative effects of this chronic  health condition over time and possibility of starting an agent at some point if refractory to diet and exercise regimen. No indication for treatment currently. Plan to follow up with PCP as needed and return precautions discussed for worsening or new concerning symptoms.   Final Clinical Impressions(s) / ED Diagnoses   Final diagnoses:  Elevated blood pressure    New Prescriptions New Prescriptions   No medications on file     Lyndal Pulley, MD 03/19/16 1111

## 2016-03-18 NOTE — ED Triage Notes (Signed)
Patient c/o dizziness and headaches x a couple of weeks. Patient states he went for a routine check up today and was told he had hypertension. Patient was given Clonidine at the office and was told to come to the ED.

## 2016-05-04 ENCOUNTER — Emergency Department (HOSPITAL_COMMUNITY)
Admission: EM | Admit: 2016-05-04 | Discharge: 2016-05-04 | Disposition: A | Discharge status: Home / Self Care | Payer: Managed Care, Other (non HMO) | Attending: Emergency Medicine | Admitting: Emergency Medicine

## 2016-05-04 ENCOUNTER — Encounter (HOSPITAL_COMMUNITY): Payer: Self-pay | Admitting: Emergency Medicine

## 2016-05-04 DIAGNOSIS — I10 Essential (primary) hypertension: Secondary | ICD-10-CM | POA: Insufficient documentation

## 2016-05-04 DIAGNOSIS — F17200 Nicotine dependence, unspecified, uncomplicated: Secondary | ICD-10-CM | POA: Insufficient documentation

## 2016-05-04 DIAGNOSIS — L0231 Cutaneous abscess of buttock: Secondary | ICD-10-CM | POA: Insufficient documentation

## 2016-05-04 DIAGNOSIS — L0291 Cutaneous abscess, unspecified: Secondary | ICD-10-CM

## 2016-05-04 MED ORDER — SULFAMETHOXAZOLE-TRIMETHOPRIM 800-160 MG PO TABS: 2.0000 | ORAL_TABLET | Freq: Two times a day (BID) | ORAL | 0 refills | Status: DC

## 2016-05-04 MED ORDER — OXYCODONE-ACETAMINOPHEN 5-325 MG PO TABS: 1.0000 | ORAL_TABLET | ORAL | 0 refills | Status: DC | PRN

## 2016-05-04 MED ORDER — LIDOCAINE-EPINEPHRINE-TETRACAINE (LET) SOLUTION
3.0000 mL | Freq: Once | NASAL | Status: AC
  Administered 2016-05-04: 3 mL via TOPICAL
  Filled 2016-05-04: qty 3

## 2016-05-04 MED ORDER — LIDOCAINE-EPINEPHRINE (PF) 1 %-1:200000 IJ SOLN
20.0000 mL | Freq: Once | INTRAMUSCULAR | Status: DC
  Filled 2016-05-04: qty 30

## 2016-05-04 MED ORDER — MORPHINE SULFATE (PF) 2 MG/ML IV SOLN
4.0000 mg | INTRAVENOUS | Status: DC | PRN
  Administered 2016-05-04: 4 mg via INTRAMUSCULAR
  Filled 2016-05-04: qty 2

## 2016-05-04 NOTE — Discharge Instructions (Signed)
Take percocet for breakthrough pain, do not drink alcohol, drive, care for children or do other critical tasks while taking percocet. ° °

## 2016-05-04 NOTE — ED Triage Notes (Signed)
Per pt, states abscess on left buttock for about a week

## 2016-05-04 NOTE — ED Provider Notes (Signed)
[facility]-EMERGENCY DEPT Provider Note   CSN: [id number] Arrival date & time: 05/04/16  1906  By signing my name below, I, Edward Osborn, attest that this documentation has been prepared under the direction and in the presence of Edward [employer] Corporation[employer] Kyliegh Jester, PA-C. Electronically Signed: Angelene GiovanniEmmanuella Osborn, ED Scribe. 05/04/16. 8:04 PM.   History   Chief Complaint Chief Complaint  Patient presents with  . Abscess    HPI Comments: Edward Osborn is a 35 y.o. male with a hx of hypertension who presents to the Emergency Department complaining of gradually worsening severely painful area of swelling and redness to his left buttock onset one week ago. He reports associated subjective fever and difficulty sitting secondary to pain. No alleviating factors noted. Pt has not tried any medications PTA. He has an allergy to Amoxicillin-pot clavulanate. He reports a hx of an abscess to the same area which required an I&D. He denies any nausea, vomiting, or any other symptoms.   The history is provided by the patient. No language interpreter was used.    Past Medical History:  Diagnosis Date  . GERD (gastroesophageal reflux disease)   . Hypertension     There are no active problems to display for this patient.   Past Surgical History:  Procedure Laterality Date  . APPENDECTOMY         Home Medications    Prior to Admission medications   Medication Sig Start Date End Date Taking? Authorizing Provider  naproxen (NAPROSYN) 500 MG tablet Take 1 tablet (500 mg total) by mouth 2 (two) times daily. Patient not taking: Reported on 03/18/2016 12/13/15   Linwood DibblesJon Knapp, MD  omeprazole (PRILOSEC) 40 MG capsule Take 40 mg by mouth daily.    Historical Provider, MD  oxyCODONE-acetaminophen (PERCOCET) 5-325 MG tablet Take 1 tablet by mouth every 4 (four) hours as needed. 05/04/16   Yvett Rossel, PA-C  sulfamethoxazole-trimethoprim (BACTRIM DS) 800-160 MG tablet Take 2 tablets by mouth 2 (two) times daily.  05/04/16   Edward ReiningNicole Aldred Mase, PA-C    Family History Family History  Problem Relation Age of Onset  . Cirrhosis Mother     Social History Social History  Substance Use Topics  . Smoking status: Current Every Day Smoker    Packs/day: 0.50  . Smokeless tobacco: Never Used  . Alcohol use Yes     Comment: socially     Allergies   Amoxicillin-pot clavulanate   Review of Systems Review of Systems  A complete 10 system review of systems was obtained and all systems are negative except as noted in the HPI and PMH.    Physical Exam Updated Vital Signs BP (!) 176/109 (BP Location: Left Arm)   Pulse 90   Temp 98.2 F (36.8 C) (Oral)   Resp 18   SpO2 97%   Physical Exam  Constitutional: He is oriented to person, place, and time. He appears well-developed and well-nourished. No distress.  HENT:  Head: Normocephalic and atraumatic.  Mouth/Throat: Oropharynx is clear and moist.  Eyes: Conjunctivae and EOM are normal. Pupils are equal, round, and reactive to light.  Neck: Normal range of motion.  Cardiovascular: Normal rate, regular rhythm and intact distal pulses.   Pulmonary/Chest: Effort normal and breath sounds normal.  Abdominal: Soft. There is no tenderness.  Musculoskeletal: Normal range of motion.  Neurological: He is alert and oriented to person, place, and time.  Skin: Skin is warm and dry. He is not diaphoretic.     Psychiatric: He has a normal mood and affect.  Nursing note and vitals reviewed.    ED Treatments / Results  DIAGNOSTIC STUDIES: Oxygen Saturation is 100% on RA, normal by my interpretation.    COORDINATION OF CARE: 7:57 PM- Pt advised of plan for treatment and pt agrees. Pt will receive Morphine and LET for an I&D.    Labs (all labs ordered are listed, but only abnormal results are displayed) Labs Reviewed - No data to display  EKG  EKG Interpretation None       Radiology No results found.  Procedures .Marland Kitchen.Incision and  Drainage Date/Time: 05/04/2016 8:01 PM Performed by: Edward Osborn, Edward Wetherington Authorized by: Edward Osborn, Benino Korinek   Consent:    Consent obtained:  Verbal   Consent given by:  Patient   Risks discussed:  Pain Location:    Type:  Abscess   Location:  Anogenital   Anogenital location:  Gluteal cleft Pre-procedure details:    Skin preparation:  Betadine Anesthesia (see MAR for exact dosages):    Anesthesia method:  Local infiltration   Local anesthetic:  Lidocaine 1% w/o epi Procedure type:    Complexity:  Simple Procedure details:    Needle aspiration: no     Scalpel blade:  11   Wound management:  Probed and deloculated   Drainage:  Bloody   Drainage amount:  Scant   Wound treatment:  Wound left open   Packing materials:  None Post-procedure details:    Patient tolerance of procedure:  Tolerated well, no immediate complications    (including critical care time)  Medications Ordered in ED Medications  lidocaine-EPINEPHrine (XYLOCAINE-EPINEPHrine) 1 %-1:200000 (PF) injection 20 mL (not administered)  morphine 2 MG/ML injection 4 mg (4 mg Intramuscular Given 05/04/16 2029)  lidocaine-EPINEPHrine-tetracaine (LET) solution (3 mLs Topical Given 05/04/16 2002)     Initial Impression / Assessment and Plan / ED Course  Edward EmeryNicole Tynisha Ogan, PA-C has reviewed the triage vital signs and the nursing notes.  Pertinent labs & imaging results that were available during my care of the patient were reviewed by me and considered in my medical decision making (see chart for details).  Clinical Course    Vitals:   05/04/16 1914 05/04/16 2120  BP: (!) 195/118 (!) 176/109  Pulse:  90  Resp: 18 18  Temp: 98.2 F (36.8 C)   TempSrc: Oral   SpO2: 100% 97%    Medications  lidocaine-EPINEPHrine (XYLOCAINE-EPINEPHrine) 1 %-1:200000 (PF) injection 20 mL (not administered)  morphine 2 MG/ML injection 4 mg (4 mg Intramuscular Given 05/04/16 2029)  lidocaine-EPINEPHrine-tetracaine (LET) solution (3 mLs  Topical Given 05/04/16 2002)    Edward Osborn is 35 y.o. male presenting with Indurated abscess to left gluteus, no signs of systemic infection. This is recurrent in nature. I and D is performed however no purulent drainage. Patient started on antibiotics and advised warm compresses, sitz bath. Blood pressure significantly elevated, the advised him to follow with his primary care on this. We had an extensive discussion of return precautions and patient verbalizes understanding and teach back technique.  Evaluation does not show pathology that would require ongoing emergent intervention or inpatient treatment. Pt is hemodynamically stable and mentating appropriately. Discussed findings and plan with patient/guardian, who agrees with care plan. All questions answered. Return precautions discussed and outpatient follow up given.   Final Clinical Impressions(s) / ED Diagnoses   Final diagnoses:  Abscess    New Prescriptions Discharge Medication List as of 05/04/2016  9:11 PM    START taking these medications   Details  oxyCODONE-acetaminophen (PERCOCET) 5-325  MG tablet Take 1 tablet by mouth every 4 (four) hours as needed., Starting Wed 05/04/2016, Print    sulfamethoxazole-trimethoprim (BACTRIM DS) 800-160 MG tablet Take 2 tablets by mouth 2 (two) times daily., Starting Wed 05/04/2016, Print       I personally performed the services described in this documentation, which was scribed in my presence. The recorded information has been reviewed and is accurate.    Edward Emery, PA-C 05/04/16 2213    Edward Emery, PA-C 05/04/16 1610    Rolland Porter, MD 05/14/16 [phone]

## 2018-06-03 ENCOUNTER — Other Ambulatory Visit: Payer: Self-pay

## 2018-06-03 ENCOUNTER — Emergency Department (HOSPITAL_COMMUNITY)
Admission: EM | Admit: 2018-06-03 | Discharge: 2018-06-03 | Discharge status: Against Medical Advice | Payer: 59 | Attending: Emergency Medicine | Admitting: Emergency Medicine

## 2018-06-03 ENCOUNTER — Emergency Department (HOSPITAL_COMMUNITY): Payer: 59

## 2018-06-03 ENCOUNTER — Encounter (HOSPITAL_COMMUNITY): Payer: Self-pay | Admitting: Emergency Medicine

## 2018-06-03 DIAGNOSIS — R0602 Shortness of breath: Secondary | ICD-10-CM | POA: Insufficient documentation

## 2018-06-03 DIAGNOSIS — F17210 Nicotine dependence, cigarettes, uncomplicated: Secondary | ICD-10-CM | POA: Diagnosis not present

## 2018-06-03 DIAGNOSIS — I10 Essential (primary) hypertension: Secondary | ICD-10-CM | POA: Diagnosis not present

## 2018-06-03 DIAGNOSIS — R51 Headache: Secondary | ICD-10-CM | POA: Insufficient documentation

## 2018-06-03 DIAGNOSIS — R0789 Other chest pain: Secondary | ICD-10-CM | POA: Insufficient documentation

## 2018-06-03 DIAGNOSIS — R079 Chest pain, unspecified: Secondary | ICD-10-CM

## 2018-06-03 LAB — CBC
HEMATOCRIT: 45.2 % (ref 39.0–52.0)
HEMOGLOBIN: 14.3 g/dL (ref 13.0–17.0)
MCH: 27 pg (ref 26.0–34.0)
MCHC: 31.6 g/dL (ref 30.0–36.0)
MCV: 85.4 fL (ref 80.0–100.0)
Platelets: 288 10*3/uL (ref 150–400)
RBC: 5.29 MIL/uL (ref 4.22–5.81)
RDW: 13.6 % (ref 11.5–15.5)
WBC: 8.7 10*3/uL (ref 4.0–10.5)
nRBC: 0 % (ref 0.0–0.2)

## 2018-06-03 LAB — I-STAT TROPONIN, ED: Troponin i, poc: 0 ng/mL (ref 0.00–0.08)

## 2018-06-03 LAB — BASIC METABOLIC PANEL
Anion gap: 11 (ref 5–15)
BUN: 9 mg/dL (ref 6–20)
CHLORIDE: 102 mmol/L (ref 98–111)
CO2: 24 mmol/L (ref 22–32)
CREATININE: 1.11 mg/dL (ref 0.61–1.24)
Calcium: 9.4 mg/dL (ref 8.9–10.3)
GFR calc Af Amer: >60 mL/min (ref >60)
GLUCOSE: 108 mg/dL — AB (ref 70–99)
POTASSIUM: 3.9 mmol/L (ref 3.5–5.1)
Sodium: 137 mmol/L (ref 135–145)

## 2018-06-03 MED ORDER — ACETAMINOPHEN 325 MG PO TABS
650.0000 mg | ORAL_TABLET | Freq: Once | ORAL | Status: AC
  Administered 2018-06-03: 650 mg via ORAL
  Filled 2018-06-03: qty 2

## 2018-06-03 NOTE — ED Notes (Signed)
Patient leaving against medical advice - he verbalized understanding of doing so and denies any further needs or questions. Reviewed importance of following up with PCP and having his blood pressure rechecked. Ambulatory with steady gait.

## 2018-06-03 NOTE — ED Triage Notes (Signed)
Pt arrives via EMS from home with reports of pressure in middle of chest that radiated to left arm after getting upset. Hx of HTN and anxiety. 324 ASA taken. EMS gave 2 nitro with no relief. Reports 8/10 HA that was there before nitro.

## 2018-06-03 NOTE — ED Provider Notes (Addendum)
[facility] EMERGENCY DEPARTMENT Provider Note   CSN: [id number] Arrival date & time: 06/03/18  1043     History   Chief Complaint Chief Complaint  Patient presents with  . Chest Pain  . Headache    HPI Edward Osborn is a 37 y.o. male.  Complaint of anterior chest pain onset 10 AM today after he witnessed an argument between his son and wife.  He feels as if he may have had a panic attack, feeling anxious.  He felt as if he could not get a deep breath.  EMS was called EMS treated patient with 2 sublingual nitroglycerin and aspirin, without relief.  He states he became pain-free upon arrival here except for twinges of pain is left anterior chest lasting a split second.  He also complains of diffuse headache onset same time as chest pain started 10 AM today.  No associated nausea or sweatiness.  HPI  Past Medical History:  Diagnosis Date  . GERD (gastroesophageal reflux disease)   . Hypertension    Anxiety There are no active problems to display for this patient.   Past Surgical History:  Procedure Laterality Date  . APPENDECTOMY          Home Medications    Prior to Admission medications   Medication Sig Start Date End Date Taking? Authorizing Provider  naproxen (NAPROSYN) 500 MG tablet Take 1 tablet (500 mg total) by mouth 2 (two) times daily. Patient not taking: Reported on 03/18/2016 12/13/15   Linwood Dibbles, MD  omeprazole (PRILOSEC) 40 MG capsule Take 40 mg by mouth daily.    [provider]  oxyCODONE-acetaminophen (PERCOCET) 5-325 MG tablet Take 1 tablet by mouth every 4 (four) hours as needed. 05/04/16   Pisciotta, Joni Reining, PA-C  sulfamethoxazole-trimethoprim (BACTRIM DS) 800-160 MG tablet Take 2 tablets by mouth 2 (two) times daily. 05/04/16   Pisciotta, Joni Reining, PA-C    Family History Family History  Problem Relation Age of Onset  . Cirrhosis Mother     Social History Social History   Tobacco Use  . Smoking status: Current  Every Day Smoker    Packs/day: 0.50  . Smokeless tobacco: Never Used  Substance Use Topics  . Alcohol use: Yes    Comment: socially  . Drug use: No  Positive smoker occasional alcohol positive marijuana use no other illicit drug use   Allergies   Amoxicillin-pot clavulanate   Review of Systems Review of Systems  Constitutional: Negative.   HENT: Negative.   Respiratory: Positive for shortness of breath.   Cardiovascular: Positive for chest pain.  Gastrointestinal: Negative.   Musculoskeletal: Negative.   Skin: Negative.   Neurological: Positive for headaches.  Psychiatric/Behavioral: The patient is nervous/anxious.   All other systems reviewed and are negative.    Physical Exam Updated Vital Signs BP (!) 155/102 (BP Location: Right Arm)   Pulse 97   Temp 98.4 F (36.9 C) (Oral)   Resp 16   Ht 5\' 8"  (1.727 m)   Wt 104.3 kg   SpO2 94%   BMI 34.97 kg/m   Physical Exam  Constitutional: He is oriented to person, place, and time. He appears well-developed and well-nourished. No distress.  HENT:  Head: Normocephalic and atraumatic.  Eyes: Pupils are equal, round, and reactive to light. Conjunctivae are normal.  Neck: Neck supple. No tracheal deviation present. No thyromegaly present.  Cardiovascular: Normal rate and regular rhythm.  No murmur heard. Pulmonary/Chest: Effort normal and breath sounds normal.  Abdominal: Soft.  Bowel sounds are normal. He exhibits no distension. There is no tenderness.  Musculoskeletal: Normal range of motion. He exhibits no edema or tenderness.  Neurological: He is alert and oriented to person, place, and time. No cranial nerve deficit. Coordination normal.  Skin: Skin is warm and dry. Capillary refill takes less than 2 seconds. No rash noted.  Psychiatric: He has a normal mood and affect.  Nursing note and vitals reviewed.    ED Treatments / Results  Labs (all labs ordered are listed, but only abnormal results are displayed) Labs  Reviewed - No data to display  EKG EKG Interpretation  Date/Time:  Sunday June 03 2018 10:49:18 EST Ventricular Rate:  99 PR Interval:    QRS Duration: 91 QT Interval:  345 QTC Calculation: 443 R Axis:   44 Text Interpretation:  Sinus rhythm Probable left atrial enlargement Abnormal R-wave progression, early transition Abnrm T, consider ischemia, anterolateral lds Minimal ST elevation, anterior leads Baseline wander in lead(s) II III aVF Lateral leads New since previous tracing Confirmed by Dresean Beckel ([id number]) on 06/03/2018 10:53:12 AM Nonspecific T wave changes in lateral leads new since previous tracing  Radiology No results found.  Procedures Procedures (including critical care time)  Medications Ordered in ED Medications - No data to display Results for orders placed or performed during the hospital encounter of 06/03/18  Basic metabolic panel  Result Value Ref Range   Sodium 137 135 - 145 mmol/L   Potassium 3.9 3.5 - 5.1 mmol/L   Chloride 102 98 - 111 mmol/L   CO2 24 22 - 32 mmol/L   Glucose, Bld 108 (H) 70 - 99 mg/dL   BUN 9 6 - 20 mg/dL   Creatinine, Ser 1.11 0.61 - 1.24 mg/dL   Calcium 9.4 8.9 - 10.3 mg/dL   GFR calc non Af Amer >60 >60 mL/min   GFR calc Af Amer >60 >60 mL/min   Anion gap 11 5 - 15  CBC  Result Value Ref Range   WBC 8.7 4.0 - 10.5 K/uL   RBC 5.29 4.22 - 5.81 MIL/uL   Hemoglobin 14.3 13.0 - 17.0 g/dL   HCT 45.2 39.0 - 52.0 %   MCV 85.4 80.0 - 100.0 fL   MCH 27.0 26.0 - 34.0 pg   MCHC 31.6 30.0 - 36.0 g/dL   RDW 13.6 11.5 - 15.5 %   Platelets 288 150 - 400 K/uL   nRBC 0.0 0.0 - 0.2 %  I-stat troponin, ED  Result Value Ref Range   Troponin i, poc 0.00 0.00 - 0.08 ng/mL   Comment 3           No results found.  Initial Impression / Assessment and Plan / ED Course  I have reviewed the triage vital signs and the nursing notes.  Pertinent labs & imaging results that were available during my care of the patient were reviewed by me and  considered in my medical decision making (see chart for details).     11  35 aM patient states "I feel much better" after treatment with Tylenol I counseled patient for 5 minutes on smoking cessation  Chest x-ray viewed by me.  11:25 AM patient is asymptomatic.  He wishes to leave the hospital.  I explained to him that without second cardiac marker I cannot be certain that he did not have myocardial infarction which is life-threatening.Marland Kitchen.  He understands risk of heart attack and death.  He will leave AGAINST MEDICAL ADVICE.  He is advised  to go to another hospital or return or see his doctor for further evaluation.  Suggest blood pressure recheck 1 week.  Lab work unremarkable.  Initial heart score 2 Final Clinical Impressions(s) / ED Diagnoses  #1 chest pain at rest #2 bad headache #3 tobacco abuse #4 elevated blood pressure Final diagnoses:  None    ED Discharge Orders    None       Doug Sou, MD 06/03/18 1232    Doug Sou, MD 06/03/18 1236

## 2018-06-03 NOTE — Discharge Instructions (Addendum)
Go to another hospital or return here or see your doctor as promptly as possible for further evaluation of your heart and chest pain.  Ask your primary care physician to help you to stop smoking.  Get your blood pressure rechecked in a week.  Today's was elevated at 164/100

## 2019-04-19 ENCOUNTER — Encounter (HOSPITAL_COMMUNITY): Payer: Self-pay

## 2019-04-19 ENCOUNTER — Emergency Department (HOSPITAL_COMMUNITY)
Admission: EM | Admit: 2019-04-19 | Discharge: 2019-04-19 | Disposition: A | Discharge status: Home / Self Care | Payer: 59 | Attending: Emergency Medicine | Admitting: Emergency Medicine

## 2019-04-19 ENCOUNTER — Other Ambulatory Visit: Payer: Self-pay

## 2019-04-19 ENCOUNTER — Emergency Department (HOSPITAL_COMMUNITY): Payer: 59

## 2019-04-19 DIAGNOSIS — I10 Essential (primary) hypertension: Secondary | ICD-10-CM | POA: Diagnosis not present

## 2019-04-19 DIAGNOSIS — F17200 Nicotine dependence, unspecified, uncomplicated: Secondary | ICD-10-CM | POA: Diagnosis not present

## 2019-04-19 DIAGNOSIS — Y939 Activity, unspecified: Secondary | ICD-10-CM | POA: Diagnosis not present

## 2019-04-19 DIAGNOSIS — Y929 Unspecified place or not applicable: Secondary | ICD-10-CM | POA: Insufficient documentation

## 2019-04-19 DIAGNOSIS — S0231XA Fracture of orbital floor, right side, initial encounter for closed fracture: Secondary | ICD-10-CM | POA: Diagnosis not present

## 2019-04-19 DIAGNOSIS — Y999 Unspecified external cause status: Secondary | ICD-10-CM | POA: Diagnosis not present

## 2019-04-19 DIAGNOSIS — Z79899 Other long term (current) drug therapy: Secondary | ICD-10-CM | POA: Diagnosis not present

## 2019-04-19 DIAGNOSIS — W51XXXA Accidental striking against or bumped into by another person, initial encounter: Secondary | ICD-10-CM | POA: Insufficient documentation

## 2019-04-19 DIAGNOSIS — S0993XA Unspecified injury of face, initial encounter: Secondary | ICD-10-CM | POA: Diagnosis present

## 2019-04-19 LAB — CBC WITH DIFFERENTIAL/PLATELET
Abs Immature Granulocytes: 0.09 10*3/uL — ABNORMAL HIGH (ref 0.00–0.07)
Basophils Absolute: 0.1 10*3/uL (ref 0.0–0.1)
Basophils Relative: 1 %
Eosinophils Absolute: 0.4 10*3/uL (ref 0.0–0.5)
Eosinophils Relative: 3 %
HCT: 49.1 % (ref 39.0–52.0)
Hemoglobin: 15.6 g/dL (ref 13.0–17.0)
Immature Granulocytes: 1 %
Lymphocytes Relative: 31 %
Lymphs Abs: 4.1 10*3/uL — ABNORMAL HIGH (ref 0.7–4.0)
MCH: 27.9 pg (ref 26.0–34.0)
MCHC: 31.8 g/dL (ref 30.0–36.0)
MCV: 87.8 fL (ref 80.0–100.0)
Monocytes Absolute: 0.8 10*3/uL (ref 0.1–1.0)
Monocytes Relative: 6 %
Neutro Abs: 7.8 10*3/uL — ABNORMAL HIGH (ref 1.7–7.7)
Neutrophils Relative %: 58 %
Platelets: 320 10*3/uL (ref 150–400)
RBC: 5.59 MIL/uL (ref 4.22–5.81)
RDW: 14.4 % (ref 11.5–15.5)
WBC: 13.3 10*3/uL — ABNORMAL HIGH (ref 4.0–10.5)
nRBC: 0 % (ref 0.0–0.2)

## 2019-04-19 LAB — BASIC METABOLIC PANEL
Anion gap: 12 (ref 5–15)
BUN: 9 mg/dL (ref 6–20)
CO2: 25 mmol/L (ref 22–32)
Calcium: 9.7 mg/dL (ref 8.9–10.3)
Chloride: 101 mmol/L (ref 98–111)
Creatinine, Ser: 1.18 mg/dL (ref 0.61–1.24)
GFR calc Af Amer: >60 mL/min (ref >60)
GFR calc non Af Amer: >60 mL/min (ref >60)
Glucose, Bld: 133 mg/dL — ABNORMAL HIGH (ref 70–99)
Potassium: 4.1 mmol/L (ref 3.5–5.1)
Sodium: 138 mmol/L (ref 135–145)

## 2019-04-19 MED ORDER — SODIUM CHLORIDE (PF) 0.9 % IJ SOLN
INTRAMUSCULAR | Status: AC
  Administered 2019-04-19: 16:00:00
  Filled 2019-04-19: qty 50

## 2019-04-19 MED ORDER — IOHEXOL 300 MG/ML  SOLN
100.0000 mL | Freq: Once | INTRAMUSCULAR | Status: AC | PRN
  Administered 2019-04-19: 100 mL via INTRAVENOUS

## 2019-04-19 MED ORDER — CLINDAMYCIN HCL 150 MG PO CAPS: 150.0000 mg | ORAL_CAPSULE | Freq: Three times a day (TID) | ORAL | 0 refills | Status: DC

## 2019-04-19 MED ORDER — CLINDAMYCIN HCL 300 MG PO CAPS
300.0000 mg | ORAL_CAPSULE | Freq: Once | ORAL | Status: AC
  Administered 2019-04-19: 300 mg via ORAL
  Filled 2019-04-19: qty 1

## 2019-04-19 MED ORDER — OXYCODONE-ACETAMINOPHEN 5-325 MG PO TABS: 1.0000 | ORAL_TABLET | Freq: Four times a day (QID) | ORAL | 0 refills | Status: DC | PRN

## 2019-04-19 MED ORDER — OXYCODONE-ACETAMINOPHEN 5-325 MG PO TABS
1.0000 | ORAL_TABLET | Freq: Once | ORAL | Status: AC
  Administered 2019-04-19: 1 via ORAL
  Filled 2019-04-19: qty 1

## 2019-04-19 NOTE — ED Provider Notes (Signed)
  Physical Exam  BP (!) 197/121 (BP Location: Left Arm)   Pulse 95   Temp 98.5 F (36.9 C) (Oral)   Resp 18   Ht 5\' 8"  (1.727 m)   Wt 108.9 kg   SpO2 99%   BMI 36.49 kg/m   Physical Exam  ED Course/Procedures     Procedures  MDM  Patient care assumed at 3:30 pm. Patient has injury to R cheek and orbit yesterday. Has some periorbital swelling and R maxillary sinus tenderness. ? R upward gaze palsy. Sign out pending CT maxillofacial.    5:05 PM CT showed R orbital floor fracture with no evidence of muscular trapping with orbital emphysema with no abscess. He is able to look up and extra ocular movements are intact. Stable for dc home with clindamycin, pain meds.     Drenda Freeze, MD 04/19/19 [phone]

## 2019-04-19 NOTE — ED Provider Notes (Signed)
[facility]   CSN: [id number] Arrival date & time: 04/19/19  1051     History   Chief Complaint Chief Complaint  Patient presents with  . Facial Injury    HPI Edward Osborn is a 38 y.o. male with a history of GERD presenting to the emergency department with facial trauma.  He reports that his young daughter accidentally had butted him in the right cheek yesterday while they were playing.  He had pain beneath his right eye at the time.  However woke up this morning and felt like his pain was worse.  He then attempted to blow his nose and had severe pain in his right orbit.  He is now describing photophobia and pain with eye movement.  He notices swelling below his right eye.  He is also reporting paresthesias over his right cheek.  He has mild headache.  He denies fevers, chills, neck stiffness or pain.  He reports he typically wears contacts but is not wearing them today.  He has allergies to amoxicillin.     HPI  Past Medical History:  Diagnosis Date  . GERD (gastroesophageal reflux disease)   . Hypertension     There are no active problems to display for this patient.   Past Surgical History:  Procedure Laterality Date  . APPENDECTOMY          Home Medications    Prior to Admission medications   Medication Sig Start Date End Date Taking? Authorizing Provider  clindamycin (CLEOCIN) 150 MG capsule Take 1 capsule (150 mg total) by mouth 3 (three) times daily. 04/19/19   Charlynne Pander, MD  escitalopram (LEXAPRO) 20 MG tablet Take 20 mg by mouth daily.    [provider]  naproxen (NAPROSYN) 500 MG tablet Take 1 tablet (500 mg total) by mouth 2 (two) times daily. Patient not taking: Reported on 03/18/2016 12/13/15   Linwood Dibbles, MD  omeprazole (PRILOSEC) 40 MG capsule Take 40 mg by mouth daily.    [provider]  oxyCODONE-acetaminophen (PERCOCET) 5-325 MG tablet Take 1 tablet by mouth every  6 (six) hours as needed. 04/19/19   Charlynne Pander, MD  sulfamethoxazole-trimethoprim (BACTRIM DS) 800-160 MG tablet Take 2 tablets by mouth 2 (two) times daily. Patient not taking: Reported on 06/03/2018 05/04/16   Pisciotta, Joni Reining, PA-C    Family History Family History  Problem Relation Age of Onset  . Cirrhosis Mother     Social History Social History   Tobacco Use  . Smoking status: Current Every Day Smoker    Packs/day: 0.50  . Smokeless tobacco: Never Used  Substance Use Topics  . Alcohol use: Yes    Comment: socially  . Drug use: No     Allergies   Amoxicillin-pot clavulanate   Review of Systems Review of Systems  Constitutional: Negative for chills and fever.  HENT: Positive for sinus pressure and sinus pain. Negative for congestion, drooling, nosebleeds and sore throat.   Eyes: Positive for photophobia, pain and visual disturbance. Negative for discharge, redness and itching.  Respiratory: Negative for cough and shortness of breath.   Cardiovascular: Negative for chest pain and palpitations.  Gastrointestinal: Negative for nausea and vomiting.  Genitourinary: Negative for hematuria.  Musculoskeletal: Negative for neck pain and neck stiffness.  Skin: Positive for rash. Negative for pallor.  Neurological: Positive for headaches. Negative for syncope.  Psychiatric/Behavioral: Negative for agitation and confusion.  All other systems reviewed and are negative.  Physical Exam Updated Vital Signs BP (!) 198/134 (BP Location: Right Arm)   Pulse 89   Temp 98.5 F (36.9 C) (Oral)   Resp 18   Ht 5\' 8"  (1.727 m)   Wt 108.9 kg   SpO2 99%   BMI 36.49 kg/m   Physical Exam Vitals signs and nursing note reviewed.  Constitutional:      Appearance: He is well-developed.  HENT:     Head: Normocephalic and atraumatic.  Eyes:     Conjunctiva/sclera: Conjunctivae normal.     Comments: Edema and erythema of the preseptal skin below right eye Conjunctiva clear  bilaterally PERRL Pain with right lateral eye gaze and palsy with upward eye gaze (right eye) Full ROM in left eye  Neck:     Musculoskeletal: Normal range of motion and neck supple. No neck rigidity.  Cardiovascular:     Rate and Rhythm: Normal rate and regular rhythm.     Pulses: Normal pulses.  Pulmonary:     Effort: Pulmonary effort is normal. No respiratory distress.  Skin:    General: Skin is warm and dry.  Neurological:     General: No focal deficit present.     Mental Status: He is alert and oriented to person, place, and time.      ED Treatments / Results  Labs (all labs ordered are listed, but only abnormal results are displayed) Labs Reviewed  BASIC METABOLIC PANEL - Abnormal; Notable for the following components:      Result Value   Glucose, Bld 133 (*)    All other components within normal limits  CBC WITH DIFFERENTIAL/PLATELET - Abnormal; Notable for the following components:   WBC 13.3 (*)    Neutro Abs 7.8 (*)    Lymphs Abs 4.1 (*)    Abs Immature Granulocytes 0.09 (*)    All other components within normal limits    EKG None  Radiology Ct Maxillofacial W Contrast  Result Date: 04/19/2019 CLINICAL DATA:  Facial trauma on right last night. Right eye swelling after blowing the nose. EXAM: CT MAXILLOFACIAL WITH CONTRAST TECHNIQUE: Multidetector CT imaging of the maxillofacial structures was performed with intravenous contrast. Multiplanar CT image reconstructions were also generated. CONTRAST:  151mL OMNIPAQUE IOHEXOL 300 MG/ML  SOLN COMPARISON:  None. FINDINGS: Osseous: The study suffers from motion degradation. I believe there is fracture of the lamina papyracea on the right posteriorly and a minor posterior orbital floor fracture without evidence muscular trapping. No other facial fracture Orbits: Swelling and orbital emphysema on the right consistent with the above fractures. There is no finding to suggest orbital inflammatory disease at this point following  injury yesterday. No evidence of orbital hematoma. No evidence of globe disruption. Sinuses: Mucosal thickening of the sinuses with retention cysts. Soft tissues: Right facial soft tissue swelling. Limited intracranial: Normal IMPRESSION: 1. Motion degraded study. 2. Right facial soft tissue swelling and right orbital emphysema consistent with the below described fractures. Probable fracture of the lamina papyracea on the right posteriorly and probable posterior orbital floor fracture. No evidence of muscular trapping. 3. No evidence of globe disruption on the right. No evidence of orbital hematoma on the right. No finding to suggest orbital inflammatory disease at this point following injury yesterday. 4. Mucosal thickening of the sinuses with retention cysts. Emphysema (ICD10-J43.9). Electronically Signed   By: Nelson Chimes M.D.   On: 04/19/2019 16:47    Procedures Procedures (including critical care time)  Medications Ordered in ED Medications  oxyCODONE-acetaminophen (PERCOCET/ROXICET) 5-325  MG per tablet 1 tablet (1 tablet Oral Given 04/19/19 1322)  iohexol (OMNIPAQUE) 300 MG/ML solution 100 mL (100 mLs Intravenous Contrast Given 04/19/19 1552)  sodium chloride (PF) 0.9 % injection (  Given by Other 04/19/19 1600)  clindamycin (CLEOCIN) capsule 300 mg (300 mg Oral Given 04/19/19 1701)     Initial Impression / Assessment and Plan / ED Course  I have reviewed the triage vital signs and the nursing notes.  Pertinent labs & imaging results that were available during my care of the patient were reviewed by me and considered in my medical decision making (see chart for details).  38 year old male presented to emergency department with head trauma to the right cheek while playing with his daughter yesterday.  He reports he blew his nose this morning and began having severe pain around his eye.  He is now is photophobia and pain with ocular eye movement, with a minor gaze palsy in the right eye and  pain with abduction of the right eye.  His pupils are equally reactive and intact.  There is no evidence of globe rupture or trauma.  Suspect he likely has an infraorbital wall fracture. However given that his pain is worse since is blowing his nose and the possibility of a sinus inoculation and infection, believe it is reasonable to obtain a CT scan of the orbits with IV contrast evaluate for orbital cellulitis.  This note was dictated using dragon dictation software.  Please be aware that there may be minor translation errors as a result of this oral dictation  Patient was signed out to Dr. Silverio LayYao pending CT scan results and disposition.     Final Clinical Impressions(s) / ED Diagnoses   Final diagnoses:  Closed fracture of right orbital floor, initial encounter [facility](HCC)    ED Discharge Orders         Ordered    clindamycin (CLEOCIN) 150 MG capsule  3 times daily     04/19/19 1709    oxyCODONE-acetaminophen (PERCOCET) 5-325 MG tablet  Every 6 hours PRN     04/19/19 1709           Terald Sleeperrifan, Gottlieb Zuercher J, MD 04/19/19 1814

## 2019-04-19 NOTE — ED Triage Notes (Signed)
Patient states he was playing with his daughter last night and she flung her head back and his him on the right side of his face. Patient states the area was sore,but today he blew his nose and had immediate swelling of the right eye and had a small amount of blood on the tissue. Patient states above the right side of his lip is numb.

## 2019-04-19 NOTE — Discharge Instructions (Signed)
Take clindamycin three times daily for a week   Take tylenol, motrin for pain   Take percocet for severe pain. Expect persistent facial swelling for a week   Please try and blow your nose with your mouth open   See ENT for follow up in a week   Return to ER if you have double vision, loss of vision, worse eye swelling

## 2020-01-26 ENCOUNTER — Encounter (HOSPITAL_COMMUNITY): Payer: Self-pay

## 2020-01-26 ENCOUNTER — Ambulatory Visit (HOSPITAL_COMMUNITY)
Admission: EM | Admit: 2020-01-26 | Discharge: 2020-01-26 | Disposition: A | Discharge status: Home / Self Care | Payer: 59 | Attending: Emergency Medicine | Admitting: Emergency Medicine

## 2020-01-26 ENCOUNTER — Other Ambulatory Visit: Payer: Self-pay

## 2020-01-26 DIAGNOSIS — F410 Panic disorder [episodic paroxysmal anxiety] without agoraphobia: Secondary | ICD-10-CM | POA: Diagnosis not present

## 2020-01-26 DIAGNOSIS — Z20822 Contact with and (suspected) exposure to covid-19: Secondary | ICD-10-CM

## 2020-01-26 LAB — SARS CORONAVIRUS 2 (TAT 6-24 HRS): SARS Coronavirus 2: NEGATIVE

## 2020-01-26 MED ORDER — TIZANIDINE HCL 4 MG PO TABS: 4.0000 mg | ORAL_TABLET | Freq: Four times a day (QID) | ORAL | 0 refills | Status: DC | PRN

## 2020-01-26 MED ORDER — HYDROXYZINE HCL 25 MG PO TABS: 25.0000 mg | ORAL_TABLET | Freq: Four times a day (QID) | ORAL | 0 refills | Status: DC

## 2020-01-26 NOTE — ED Triage Notes (Signed)
Patient is here today with complaints of anxiety attack that began within the last 20 minutes.

## 2020-01-26 NOTE — ED Notes (Signed)
EKG performed by Marcelino Duster, CMA

## 2020-01-26 NOTE — Discharge Instructions (Addendum)
COVID test pending, monitor MyChart for results Tizanidine as needed for cramps Focus on fluids, rest and rehydration- gatorade, pedialyte Hydroxyzine as needed Follow up in emergency room if not improving or worsening

## 2020-01-26 NOTE — ED Triage Notes (Signed)
Patient is pacing in treatment room, patient is joking, says he is fine.  Patient has placed the chair against door.  This does not prevent entering, just hinders entrance into room.  When asked why, he reported that someone walked into room without knocking.  Patient is pacing.  Continues to make comments about wife possibly having an affair.

## 2020-01-27 NOTE — ED Provider Notes (Signed)
[facility]    CSN: [id number] Arrival date & time: 01/26/20  1304      History   Chief Complaint Chief Complaint  Patient presents with   Panic Attack    HPI Edward Osborn is a 39 y.o. male history of GERD, hypertension, presenting today for evaluation of cramping and anxiousness.  Patient reports that he has had some shortness of breath with wearing the mask, numbness in his hands and feet as well as some intermittent cramping throughout his neck and side.  Symptoms began all of a sudden approximately 1 hour ago.  He expresses a lot of personal stress in relation to his wife and possible Covid exposure recently.  As he is sitting he is frequently grasping various parts of his body and pain.  Then releases briefly.  He initially denies any sensations of chest pain, but as he is sitting there at one point he grasps his chest.  He reports last cocaine use 2 days ago.  Denies other recreational drug use besides marijuana.  Does endorse tobacco use.  He reports poor sleep and poor oral intake of food of recently as well due to stress in relation to a upcoming job deadline for project.  HPI  Past Medical History:  Diagnosis Date   GERD (gastroesophageal reflux disease)    Hypertension     There are no problems to display for this patient.   Past Surgical History:  Procedure Laterality Date   APPENDECTOMY         Home Medications    Prior to Admission medications   Medication Sig Start Date End Date Taking? Authorizing Provider  hydrOXYzine (ATARAX/VISTARIL) 25 MG tablet Take 1 tablet (25 mg total) by mouth every 6 (six) hours. 01/26/20   Flynn Gwyn C, PA-C  omeprazole (PRILOSEC) 40 MG capsule Take 40 mg by mouth daily.    [provider]  tiZANidine (ZANAFLEX) 4 MG tablet Take 1 tablet (4 mg total) by mouth every 6 (six) hours as needed for muscle spasms. 01/26/20   Dessie Tatem C, PA-C  escitalopram (LEXAPRO) 20 MG tablet Take 20 mg by  mouth daily.  01/26/20  [provider]    Family History Family History  Problem Relation Age of Onset   Cirrhosis Mother     Social History Social History   Tobacco Use   Smoking status: Current Every Day Smoker    Packs/day: 0.50   Smokeless tobacco: Never Used  Vaping Use   Vaping Use: Some days   Substances: Nicotine, Flavoring  Substance Use Topics   Alcohol use: Yes    Comment: socially   Drug use: No     Allergies   Amoxicillin-pot clavulanate   Review of Systems Review of Systems  Constitutional: Negative for fatigue and fever.  HENT: Negative for congestion, sinus pressure and sore throat.   Eyes: Negative for photophobia, pain and visual disturbance.  Respiratory: Positive for shortness of breath. Negative for cough.   Cardiovascular: Negative for chest pain and leg swelling.  Gastrointestinal: Negative for abdominal pain, nausea and vomiting.  Genitourinary: Negative for decreased urine volume and hematuria.  Musculoskeletal: Positive for myalgias. Negative for neck pain and neck stiffness.  Neurological: Positive for headaches. Negative for dizziness, syncope, facial asymmetry, speech difficulty, weakness, light-headedness and numbness.  Psychiatric/Behavioral: The patient is nervous/anxious.      Physical Exam Triage Vital Signs ED Triage Vitals  Enc Vitals Group     BP 01/26/20 1307 (!) 150/108  Pulse Rate 01/26/20 1307 (!) 108     Resp 01/26/20 1307 20     Temp --      Temp src --      SpO2 01/26/20 1307 100 %     Weight --      Height --      Head Circumference --      Peak Flow --      Pain Score 01/26/20 1309 0     Pain Loc --      Pain Edu? --      Excl. in GC? --    No data found.  Updated Vital Signs BP (!) 150/108 (BP Location: Right Arm)    Pulse (!) 122    Resp 20    SpO2 100%   Visual Acuity Right Eye Distance:   Left Eye Distance:   Bilateral Distance:    Right Eye Near:   Left Eye Near:      Bilateral Near:     Physical Exam Vitals and nursing note reviewed.  Constitutional:      Appearance: He is well-developed.     Comments: No acute distress  HENT:     Head: Normocephalic and atraumatic.     Comments: Face symmetric    Nose: Nose normal.     Mouth/Throat:     Comments: Oral mucosa pink and moist, no tonsillar enlargement or exudate. Posterior pharynx patent and nonerythematous, no uvula deviation or swelling. Normal phonation.  Eyes:     Conjunctiva/sclera: Conjunctivae normal.  Cardiovascular:     Rate and Rhythm: Tachycardia present.  Pulmonary:     Effort: Pulmonary effort is normal. No respiratory distress.     Comments: Breathing comfortably at rest, CTABL, no wheezing, rales or other adventitious sounds auscultated  Abdominal:     General: There is no distension.  Musculoskeletal:        General: Normal range of motion.     Cervical back: Neck supple.     Comments: Using all extremities appropriately, strength 5/5 and equal bilaterally with grip strength  Ambulating without assistance  Frequently grabbing at various sides of neck and right flank  Skin:    General: Skin is warm and dry.  Neurological:     General: No focal deficit present.     Mental Status: He is alert and oriented to person, place, and time. Mental status is at baseline.      UC Treatments / Results  Labs (all labs ordered are listed, but only abnormal results are displayed) Labs Reviewed  SARS CORONAVIRUS 2 (TAT 6-24 HRS)    EKG   Radiology No results found.  Procedures Procedures (including critical care time)  Medications Ordered in UC Medications - No data to display  Initial Impression / Assessment and Plan / UC Course  I have reviewed the triage vital signs and the nursing notes.  Pertinent labs & imaging results that were available during my care of the patient were reviewed by me and considered in my medical decision making (see chart for  details).  Clinical Course as of Jan 26 1434  Sun Jan 26, 2020  1358 HR rechecked, did calm to 125   [HW]    Clinical Course User Index [HW] Jamyiah Labella C, PA-C    EKG sinus tachycardia at 141, discussed with patient significant elevation of heart rate along with his anxiousness, recommended initially for patient to be further evaluated emergency room to have further monitoring and blood work/troponins obtained to  check for any underlying electrolyte abnormalities and fluids if developing any electrolyte abnormality causing cramping.  Symptoms do seem a lot related to anxiety given personal stressors which she continues to talk about and while talking about them his symptoms worsen.  He is more calm and cramping less when he is not discussing his wife.  Patient expresses strong aversion to going to the emergency room.  Placed patient in room for 20 to 30 minutes with some fluids and crackers, rechecked and heart rate had improved to 120s.  Patient feels he is "fine" and does not need to go to the emergency room.  Patient does seem more stable after discussion with him and time.  Covid test pending given his exposure.  Will send home with muscle relaxers and hydroxyzine as needed for anxiety.  Patient verbalized understanding unable to rule out any cardiac cause at this time solely based off EKG and wishes to proceed with this plan.  Stressed importance of any symptoms returning to follow-up in the emergency room.  Discussed strict return precautions. Patient verbalized understanding and is agreeable with plan.  Final Clinical Impressions(s) / UC Diagnoses   Final diagnoses:  Exposure to COVID-19 virus  Anxiety attack     Discharge Instructions     COVID test pending, monitor MyChart for results Tizanidine as needed for cramps Focus on fluids, rest and rehydration- gatorade, pedialyte Hydroxyzine as needed Follow up in emergency room if not improving or worsening    ED  Prescriptions    Medication Sig Dispense Auth. Provider   hydrOXYzine (ATARAX/VISTARIL) 25 MG tablet Take 1 tablet (25 mg total) by mouth every 6 (six) hours. 16 tablet Shirrell Solinger C, PA-C   tiZANidine (ZANAFLEX) 4 MG tablet Take 1 tablet (4 mg total) by mouth every 6 (six) hours as needed for muscle spasms. 30 tablet Kevante Lunt, [place] C, PA-C     I have reviewed the PDMP during this encounter.   Lew Dawes, [place] 01/27/20 1444

## 2020-12-03 ENCOUNTER — Emergency Department (HOSPITAL_COMMUNITY): Payer: 59

## 2020-12-03 ENCOUNTER — Emergency Department (HOSPITAL_COMMUNITY)
Admission: EM | Admit: 2020-12-03 | Discharge: 2020-12-03 | Disposition: A | Discharge status: Home / Self Care | Payer: 59 | Attending: Emergency Medicine | Admitting: Emergency Medicine

## 2020-12-03 ENCOUNTER — Encounter (HOSPITAL_COMMUNITY): Payer: Self-pay | Admitting: *Deleted

## 2020-12-03 DIAGNOSIS — I10 Essential (primary) hypertension: Secondary | ICD-10-CM | POA: Diagnosis not present

## 2020-12-03 DIAGNOSIS — T50901A Poisoning by unspecified drugs, medicaments and biological substances, accidental (unintentional), initial encounter: Secondary | ICD-10-CM

## 2020-12-03 DIAGNOSIS — X58XXXA Exposure to other specified factors, initial encounter: Secondary | ICD-10-CM | POA: Diagnosis not present

## 2020-12-03 DIAGNOSIS — T402X1A Poisoning by other opioids, accidental (unintentional), initial encounter: Secondary | ICD-10-CM | POA: Diagnosis not present

## 2020-12-03 DIAGNOSIS — F17200 Nicotine dependence, unspecified, uncomplicated: Secondary | ICD-10-CM | POA: Insufficient documentation

## 2020-12-03 DIAGNOSIS — Z7982 Long term (current) use of aspirin: Secondary | ICD-10-CM | POA: Insufficient documentation

## 2020-12-03 DIAGNOSIS — R7989 Other specified abnormal findings of blood chemistry: Secondary | ICD-10-CM

## 2020-12-03 DIAGNOSIS — M545 Low back pain, unspecified: Secondary | ICD-10-CM | POA: Insufficient documentation

## 2020-12-03 LAB — CBC WITH DIFFERENTIAL/PLATELET
Abs Immature Granulocytes: 1.11 10*3/uL — ABNORMAL HIGH (ref 0.00–0.07)
Basophils Absolute: 0.1 10*3/uL (ref 0.0–0.1)
Basophils Relative: 0 %
Eosinophils Absolute: 0 10*3/uL (ref 0.0–0.5)
Eosinophils Relative: 0 %
HCT: 44.2 % (ref 39.0–52.0)
Hemoglobin: 13.7 g/dL (ref 13.0–17.0)
Immature Granulocytes: 4 %
Lymphocytes Relative: 3 %
Lymphs Abs: 0.7 10*3/uL (ref 0.7–4.0)
MCH: 27.6 pg (ref 26.0–34.0)
MCHC: 31 g/dL (ref 30.0–36.0)
MCV: 88.9 fL (ref 80.0–100.0)
Monocytes Absolute: 1.4 10*3/uL — ABNORMAL HIGH (ref 0.1–1.0)
Monocytes Relative: 5 %
Neutro Abs: 23.5 10*3/uL — ABNORMAL HIGH (ref 1.7–7.7)
Neutrophils Relative %: 88 %
Platelets: 296 10*3/uL (ref 150–400)
RBC: 4.97 MIL/uL (ref 4.22–5.81)
RDW: 13.9 % (ref 11.5–15.5)
WBC: 26.9 10*3/uL — ABNORMAL HIGH (ref 4.0–10.5)
nRBC: 0 % (ref 0.0–0.2)

## 2020-12-03 LAB — COMPREHENSIVE METABOLIC PANEL
ALT: 30 U/L (ref 0–44)
AST: 38 U/L (ref 15–41)
Albumin: 3.9 g/dL (ref 3.5–5.0)
Alkaline Phosphatase: 73 U/L (ref 38–126)
Anion gap: 14 (ref 5–15)
BUN: 10 mg/dL (ref 6–20)
CO2: 20 mmol/L — ABNORMAL LOW (ref 22–32)
Calcium: 8.6 mg/dL — ABNORMAL LOW (ref 8.9–10.3)
Chloride: 105 mmol/L (ref 98–111)
Creatinine, Ser: 1.76 mg/dL — ABNORMAL HIGH (ref 0.61–1.24)
GFR, Estimated: 50 mL/min — ABNORMAL LOW (ref >60)
Glucose, Bld: 128 mg/dL — ABNORMAL HIGH (ref 70–99)
Potassium: 3.9 mmol/L (ref 3.5–5.1)
Sodium: 139 mmol/L (ref 135–145)
Total Bilirubin: 0.3 mg/dL (ref 0.3–1.2)
Total Protein: 7.5 g/dL (ref 6.5–8.1)

## 2020-12-03 LAB — URINALYSIS, ROUTINE W REFLEX MICROSCOPIC
Bacteria, UA: NONE SEEN
Bilirubin Urine: NEGATIVE
Glucose, UA: >=500 mg/dL — AB
Hgb urine dipstick: NEGATIVE
Ketones, ur: NEGATIVE mg/dL
Leukocytes,Ua: NEGATIVE
Nitrite: NEGATIVE
Protein, ur: 100 mg/dL — AB
Specific Gravity, Urine: 1.009 (ref 1.005–1.030)
pH: 5 (ref 5.0–8.0)

## 2020-12-03 LAB — RAPID URINE DRUG SCREEN, HOSP PERFORMED
Amphetamines: NOT DETECTED
Barbiturates: NOT DETECTED
Benzodiazepines: NOT DETECTED
Cocaine: POSITIVE — AB
Opiates: POSITIVE — AB
Tetrahydrocannabinol: NOT DETECTED

## 2020-12-03 LAB — ETHANOL: Alcohol, Ethyl (B): 90 mg/dL — ABNORMAL HIGH (ref <10)

## 2020-12-03 MED ORDER — SODIUM CHLORIDE 0.9 % IV BOLUS
1000.0000 mL | Freq: Once | INTRAVENOUS | Status: AC
  Administered 2020-12-03: 1000 mL via INTRAVENOUS

## 2020-12-03 MED ORDER — ACETAMINOPHEN 325 MG PO TABS
650.0000 mg | ORAL_TABLET | Freq: Once | ORAL | Status: AC
  Administered 2020-12-03: 650 mg via ORAL
  Filled 2020-12-03: qty 2

## 2020-12-03 MED ORDER — LIDOCAINE 5 % EX PTCH
1.0000 | MEDICATED_PATCH | CUTANEOUS | Status: DC
  Administered 2020-12-03: 1 via TRANSDERMAL
  Filled 2020-12-03: qty 1

## 2020-12-03 NOTE — ED Notes (Signed)
PT HAVE BEEN MADE AWARE OF URINE SAMPLE. UNABLE TO GIVE AT THIS TIME

## 2020-12-03 NOTE — Discharge Instructions (Signed)
Your kidney function test was little bit elevated today.  Please follow-up with a primary care doctor to have this rechecked.  If you do not have 1, please call the phone number on your discharge paperwork in order to establish with 1.  Please stop using drugs as they are detrimental to your overall health.  Please return to the ER for any new or worsening symptoms.

## 2020-12-03 NOTE — ED Notes (Signed)
Ems drop of PT cell phone wallet and red hat @0923 

## 2020-12-03 NOTE — ED Notes (Addendum)
Pt fell back asleep, sats dropped to 89. BP decreased. Pt placed on 2L [place] and aroused. Sats increased to 98. PA aware.

## 2020-12-03 NOTE — ED Provider Notes (Signed)
[facility]-EMERGENCY DEPT Provider Note   CSN: [id number] Arrival date & time: 12/03/20  0706     History Chief Complaint  Patient presents with  . Drug Overdose    Edward Osborn is a 40 y.o. male.  HPI 40 year old male who presents to the ER after a reported overdose. EMS found him with a faint pulse, not breathing. Given narcan with successful resuscitation.  Patient reports no memory of what transpired overnight, reports drinking multiple gin drinks last night.  Admitted to nursing staff that he also used cocaine, and likely opioids.  He currently complains of left-sided low back pain.  No known falls or injuries.  Patient denies any SI or HI.    Past Medical History:  Diagnosis Date  . GERD (gastroesophageal reflux disease)   . Hypertension     There are no problems to display for this patient.   Past Surgical History:  Procedure Laterality Date  . APPENDECTOMY         Family History  Problem Relation Age of Onset  . Cirrhosis Mother     Social History   Tobacco Use  . Smoking status: Current Every Day Smoker    Packs/day: 0.50  . Smokeless tobacco: Never Used  Vaping Use  . Vaping Use: Some days  . Substances: Nicotine, Flavoring  Substance Use Topics  . Alcohol use: Yes    Comment: socially  . Drug use: No    Home Medications Prior to Admission medications   Medication Sig Start Date End Date Taking? Authorizing Provider  aspirin-acetaminophen-caffeine (EXCEDRIN MIGRAINE) [id number] MG tablet Take 1 tablet by mouth every 6 (six) hours as needed for headache.   Yes [provider]  escitalopram (LEXAPRO) 20 MG tablet Take 20 mg by mouth daily.  01/26/20  [provider]    Allergies    Amoxicillin-pot clavulanate  Review of Systems   Review of Systems  Constitutional: Negative for chills and fever.  HENT: Negative for ear pain and sore throat.   Eyes: Negative for pain and visual disturbance.   Respiratory: Negative for cough and shortness of breath.   Cardiovascular: Negative for chest pain and palpitations.  Gastrointestinal: Negative for abdominal pain and vomiting.  Genitourinary: Negative for dysuria and hematuria.  Musculoskeletal: Positive for back pain. Negative for arthralgias.  Skin: Negative for color change and rash.  Neurological: Negative for seizures and syncope.  All other systems reviewed and are negative.   Physical Exam Updated Vital Signs BP (!) 152/90   Pulse 97   Temp 97.7 F (36.5 C) (Oral)   Resp 15   SpO2 100%   Physical Exam Vitals and nursing note reviewed.  Constitutional:      General: He is not in acute distress.    Appearance: He is well-developed. He is not ill-appearing or diaphoretic.     Comments: A&Ox3   HENT:     Head: Normocephalic and atraumatic.  Eyes:     Conjunctiva/sclera: Conjunctivae normal.  Cardiovascular:     Rate and Rhythm: Normal rate and regular rhythm.     Heart sounds: No murmur heard.   Pulmonary:     Effort: Pulmonary effort is normal. No respiratory distress.     Breath sounds: Normal breath sounds.  Abdominal:     Palpations: Abdomen is soft.     Tenderness: There is no abdominal tenderness.  Musculoskeletal:        General: Normal range of motion.     Cervical  back: Neck supple.     Right lower leg: No edema.     Left lower leg: No edema.  Skin:    General: Skin is warm and dry.  Neurological:     General: No focal deficit present.     Mental Status: He is alert and oriented to person, place, and time.     Sensory: No sensory deficit.     Motor: No weakness.  Psychiatric:        Mood and Affect: Mood normal.        Behavior: Behavior normal.     ED Results / Procedures / Treatments   Labs (all labs ordered are listed, but only abnormal results are displayed) Labs Reviewed  CBC WITH DIFFERENTIAL/PLATELET - Abnormal; Notable for the following components:      Result Value   WBC 26.9  (*)    Neutro Abs 23.5 (*)    Monocytes Absolute 1.4 (*)    Abs Immature Granulocytes 1.11 (*)    All other components within normal limits  COMPREHENSIVE METABOLIC PANEL - Abnormal; Notable for the following components:   CO2 20 (*)    Glucose, Bld 128 (*)    Creatinine, Ser 1.76 (*)    Calcium 8.6 (*)    GFR, Estimated 50 (*)    All other components within normal limits  ETHANOL - Abnormal; Notable for the following components:   Alcohol, Ethyl (B) 90 (*)    All other components within normal limits  RAPID URINE DRUG SCREEN, HOSP PERFORMED - Abnormal; Notable for the following components:   Opiates POSITIVE (*)    Cocaine POSITIVE (*)    All other components within normal limits  URINALYSIS, ROUTINE W REFLEX MICROSCOPIC - Abnormal; Notable for the following components:   APPearance HAZY (*)    Glucose, UA >=500 (*)    Protein, ur 100 (*)    All other components within normal limits    EKG EKG Interpretation  Date/Time:  Thursday December 03 2020 07:20:34 EDT Ventricular Rate:  112 PR Interval:  132 QRS Duration: 98 QT Interval:  345 QTC Calculation: 471 R Axis:   59 Text Interpretation: Sinus tachycardia Abnormal R-wave progression, early transition Probable LVH with secondary repol abnrm No acute changes Confirmed by Derwood Kaplan [phone]) on 12/03/2020 1:06:55 PM   Radiology DG Lumbar Spine Complete  Result Date: 12/03/2020 CLINICAL DATA:  Low back pain.  Uncertain trauma. EXAM: LUMBAR SPINE - COMPLETE 4+ VIEW COMPARISON:  None. FINDINGS: There is no evidence of lumbar spine fracture. Alignment is normal. Intervertebral disc spaces are maintained. IMPRESSION: Negative. Electronically Signed   By: Lupita Raider M.D.   On: 12/03/2020 08:38   US Renal  Result Date: 12/03/2020 CLINICAL DATA:  Acute kidney injury, history hypertension, smoker EXAM: RENAL / URINARY TRACT ULTRASOUND COMPLETE COMPARISON:  None FINDINGS: Right Kidney: Renal measurements: 13.7 x 5.5 x 5.9 cm =  volume: 230 mL. Normal cortical thickness and echogenicity. No mass, hydronephrosis or shadowing calcification. Left Kidney: Renal measurements: 13.2 x 6.7 x 6.2 cm = volume: 286 mL. Normal cortical thickness and echogenicity. No mass, hydronephrosis or shadowing calcification. Bladder: Appears normal for degree of bladder distention. Other: N/A IMPRESSION: Normal exam. Electronically Signed   By: Ulyses Southward M.D.   On: 12/03/2020 10:50    Procedures Procedures   Medications Ordered in ED Medications  lidocaine (LIDODERM) 5 % 1 patch (1 patch Transdermal Patch Applied 12/03/20 1102)  acetaminophen (TYLENOL) tablet 650 mg (650 mg  Oral Given 12/03/20 0759)  sodium chloride 0.9 % bolus 1,000 mL (0 mLs Intravenous Stopped 12/03/20 1102)    ED Course  I have reviewed the triage vital signs and the nursing notes.  Pertinent labs & imaging results that were available during my care of the patient were reviewed by me and considered in my medical decision making (see chart for details).    MDM Rules/Calculators/A&P                         40 year old male presents with drug overdose.  On arrival, he is alert, slightly confused but able to answer questions appropriately.  Vitals reassuring.  He has some midline tenderness to the lumbar spine as well as some left-sided flank tenderness.  Neurologic exam otherwise normal.  Labs and imaging ordered, reviewed and interpreted by me.  CBC with a leukocytosis of 26.9, could be infectious in origin or due to drug use.  Patient is afebrile here.  CMP without any significant electrolyte abnormalities, he does have evidence of dehydration with a creatinine of 1.76, again likely due to drug use.  UDS consistent with opioids and cocaine.  Ethanol 90.  Lumbar x-ray without any evidence of fractures.  Given elevated creatinine in the setting of left lower flank pain, renal ultrasound was ordered.  This was overall reassuring.  Patient was given Tylenol and Lidoderm patch  for pain.  UA without evidence of UTI.  With no obvious signs of infection, I do suspect that the patient's leukocytosis is secondary to drug use.  He has no cough, abdominal pain.  No signs of pyelonephritis or renal stone.  Patient encouraged to follow-up with the PCP for recheck of his renal function.  He received 1/2 L of fluids here in the ER.  We discussed return precautions.  Encourage cessation of drug and alcohol use.  He voiced understanding and is agreeable.  Stable for discharge.  Case discussed with Dr. Rhunette Croft who is agreeable to the above plan and disposition  Final Clinical Impression(s) / ED Diagnoses Final diagnoses:  None    Rx / DC Orders ED Discharge Orders    None       Mare Ferrari, PA-C 12/03/20 1323    Derwood Kaplan, MD 12/04/20 [phone]

## 2020-12-03 NOTE — ED Notes (Signed)
Patient made aware we need a urine sample. His urinal is at the bedside.

## 2020-12-03 NOTE — ED Triage Notes (Addendum)
Per EMS, called out for overdose and respiratory arrest. First responders said he was not breathing with faint pulse. 2mg  narcan In, 3mg  IV given. Pt is A&O after narcan. Smells of ETOH. Complaining of back pain. 12 lead unremarkable  80/50 300cc fluids given en route HR 110 O2 98% RR 22 CBG 105  He admits to cocaine, marijuana, and alcohol use. He said he thinks there were opioids as well.

## 2020-12-09 ENCOUNTER — Emergency Department (HOSPITAL_COMMUNITY): Payer: 59

## 2020-12-09 ENCOUNTER — Encounter (HOSPITAL_COMMUNITY): Payer: Self-pay

## 2020-12-09 ENCOUNTER — Emergency Department (HOSPITAL_COMMUNITY)
Admission: EM | Admit: 2020-12-09 | Discharge: 2020-12-09 | Disposition: A | Discharge status: Home / Self Care | Payer: 59 | Attending: Emergency Medicine | Admitting: Emergency Medicine

## 2020-12-09 DIAGNOSIS — Y902 Blood alcohol level of 40-59 mg/100 ml: Secondary | ICD-10-CM | POA: Insufficient documentation

## 2020-12-09 DIAGNOSIS — F17200 Nicotine dependence, unspecified, uncomplicated: Secondary | ICD-10-CM | POA: Diagnosis not present

## 2020-12-09 DIAGNOSIS — R400 Somnolence: Secondary | ICD-10-CM | POA: Insufficient documentation

## 2020-12-09 DIAGNOSIS — Z7982 Long term (current) use of aspirin: Secondary | ICD-10-CM | POA: Insufficient documentation

## 2020-12-09 DIAGNOSIS — I10 Essential (primary) hypertension: Secondary | ICD-10-CM | POA: Diagnosis not present

## 2020-12-09 DIAGNOSIS — F1910 Other psychoactive substance abuse, uncomplicated: Secondary | ICD-10-CM | POA: Insufficient documentation

## 2020-12-09 LAB — CBC WITH DIFFERENTIAL/PLATELET
Abs Immature Granulocytes: 1.38 10*3/uL — ABNORMAL HIGH (ref 0.00–0.07)
Basophils Absolute: 0.2 10*3/uL — ABNORMAL HIGH (ref 0.0–0.1)
Basophils Relative: 1 %
Eosinophils Absolute: 0 10*3/uL (ref 0.0–0.5)
Eosinophils Relative: 0 %
HCT: 46.1 % (ref 39.0–52.0)
Hemoglobin: 14.2 g/dL (ref 13.0–17.0)
Immature Granulocytes: 5 %
Lymphocytes Relative: 4 %
Lymphs Abs: 1.3 10*3/uL (ref 0.7–4.0)
MCH: 28.3 pg (ref 26.0–34.0)
MCHC: 30.8 g/dL (ref 30.0–36.0)
MCV: 91.8 fL (ref 80.0–100.0)
Monocytes Absolute: 2.6 10*3/uL — ABNORMAL HIGH (ref 0.1–1.0)
Monocytes Relative: 9 %
Neutro Abs: 23.4 10*3/uL — ABNORMAL HIGH (ref 1.7–7.7)
Neutrophils Relative %: 81 %
Platelets: 374 10*3/uL (ref 150–400)
RBC: 5.02 MIL/uL (ref 4.22–5.81)
RDW: 14.2 % (ref 11.5–15.5)
WBC: 28.8 10*3/uL — ABNORMAL HIGH (ref 4.0–10.5)
nRBC: 0 % (ref 0.0–0.2)

## 2020-12-09 LAB — BASIC METABOLIC PANEL
Anion gap: 10 (ref 5–15)
BUN: 9 mg/dL (ref 6–20)
CO2: 20 mmol/L — ABNORMAL LOW (ref 22–32)
Calcium: 8.6 mg/dL — ABNORMAL LOW (ref 8.9–10.3)
Chloride: 108 mmol/L (ref 98–111)
Creatinine, Ser: 1.78 mg/dL — ABNORMAL HIGH (ref 0.61–1.24)
GFR, Estimated: 49 mL/min — ABNORMAL LOW (ref >60)
Glucose, Bld: 58 mg/dL — ABNORMAL LOW (ref 70–99)
Potassium: 5 mmol/L (ref 3.5–5.1)
Sodium: 138 mmol/L (ref 135–145)

## 2020-12-09 LAB — RAPID URINE DRUG SCREEN, HOSP PERFORMED
Amphetamines: POSITIVE — AB
Barbiturates: NOT DETECTED
Benzodiazepines: NOT DETECTED
Cocaine: POSITIVE — AB
Opiates: POSITIVE — AB
Tetrahydrocannabinol: POSITIVE — AB

## 2020-12-09 LAB — ETHANOL: Alcohol, Ethyl (B): 43 mg/dL — ABNORMAL HIGH (ref <10)

## 2020-12-09 MED ORDER — SODIUM CHLORIDE 0.9 % IV BOLUS
1000.0000 mL | Freq: Once | INTRAVENOUS | Status: AC
  Administered 2020-12-09: 1000 mL via INTRAVENOUS

## 2020-12-09 MED ORDER — NALOXONE HCL 4 MG/0.1ML NA LIQD: NASAL | 0 refills | Status: DC

## 2020-12-09 MED ORDER — NALOXONE HCL 2 MG/2ML IJ SOSY
PREFILLED_SYRINGE | INTRAMUSCULAR | Status: AC
  Administered 2020-12-09: 1 mg
  Filled 2020-12-09: qty 2

## 2020-12-09 NOTE — Discharge Instructions (Signed)
Return for any problem.  ?

## 2020-12-09 NOTE — ED Triage Notes (Signed)
Pt biba for seizure like activity in car in hotel parking lot. Pt awakes to voice but falling asleep during exam, admits to using cocaine. md at bedside.

## 2020-12-09 NOTE — ED Notes (Signed)
Patient ambulated, did not experience any dizziness or unsteadiness. O2 sats maintained between 95-98% on room air.

## 2020-12-09 NOTE — ED Provider Notes (Addendum)
[facility]-EMERGENCY DEPT Provider Note   CSN: [id number] Arrival date & time: 12/09/20  0947     History Chief Complaint  Patient presents with  . Drug Overdose    Edward Osborn is a 40 y.o. male.  40 year old male with prior medical history as detailed below presents for evaluation.  He arrives by EMS.  Patient was found in his car - in the driver seat.  He was semiresponsive.  He had a recently used crack pipe next to him in the car.  Patient reports to me that he has been "drinking a lot" overnight and he had just "smoked some crack."  Patient is slightly somnolent.  He is arousable.  He does answer questions appropriately.  He denies SI or HI.  Patient would fall asleep in the middle of the evaluation.  He was noted to be mildly hypoxic.  Mental status improved significantly with administration of 1 mg of Narcan.  The history is provided by the patient and medical records.  Drug Overdose This is a new problem. The current episode started 1 to 2 hours ago. The problem occurs every several days. The problem has not changed since onset.Pertinent negatives include no chest pain and no abdominal pain. Nothing aggravates the symptoms. Nothing relieves the symptoms.       Past Medical History:  Diagnosis Date  . GERD (gastroesophageal reflux disease)   . Hypertension     There are no problems to display for this patient.   Past Surgical History:  Procedure Laterality Date  . APPENDECTOMY         Family History  Problem Relation Age of Onset  . Cirrhosis Mother     Social History   Tobacco Use  . Smoking status: Current Every Day Smoker    Packs/day: 0.50  . Smokeless tobacco: Never Used  Vaping Use  . Vaping Use: Some days  . Substances: Nicotine, Flavoring  Substance Use Topics  . Alcohol use: Yes    Comment: socially  . Drug use: No    Home Medications Prior to Admission medications   Medication Sig Start Date End Date  Taking? Authorizing Provider  aspirin-acetaminophen-caffeine (EXCEDRIN MIGRAINE) [phone] MG tablet Take 1 tablet by mouth every 6 (six) hours as needed for headache.   Yes [provider]  escitalopram (LEXAPRO) 20 MG tablet Take 20 mg by mouth daily.  01/26/20  [provider]    Allergies    Amoxicillin-pot clavulanate  Review of Systems   Review of Systems  Cardiovascular: Negative for chest pain.  Gastrointestinal: Negative for abdominal pain.  All other systems reviewed and are negative.   Physical Exam Updated Vital Signs BP 117/65   Pulse 99   Temp 98.1 F (36.7 C) (Oral)   Resp 17   SpO2 95%   Physical Exam Vitals and nursing note reviewed.  Constitutional:      General: He is not in acute distress.    Appearance: He is well-developed.     Comments: Somnolent but arousable with verbal stimulation.  Significant improvement in mental status and alertness level with administration of Narcan.  HENT:     Head: Normocephalic and atraumatic.  Eyes:     Conjunctiva/sclera: Conjunctivae normal.     Pupils: Pupils are equal, round, and reactive to light.  Cardiovascular:     Rate and Rhythm: Normal rate and regular rhythm.     Heart sounds: Normal heart sounds.  Pulmonary:     Effort: Pulmonary effort  is normal. No respiratory distress.     Breath sounds: Normal breath sounds.  Abdominal:     General: There is no distension.     Palpations: Abdomen is soft.     Tenderness: There is no abdominal tenderness.  Musculoskeletal:        General: No deformity. Normal range of motion.     Cervical back: Normal range of motion and neck supple.  Skin:    General: Skin is warm and dry.  Neurological:     General: No focal deficit present.     Mental Status: He is oriented to person, place, and time.     ED Results / Procedures / Treatments   Labs (all labs ordered are listed, but only abnormal results are displayed) Labs Reviewed  BASIC METABOLIC  PANEL - Abnormal; Notable for the following components:      Result Value   CO2 20 (*)    Glucose, Bld 58 (*)    Creatinine, Ser 1.78 (*)    Calcium 8.6 (*)    GFR, Estimated 49 (*)    All other components within normal limits  ETHANOL - Abnormal; Notable for the following components:   Alcohol, Ethyl (B) 43 (*)    All other components within normal limits  CBC WITH DIFFERENTIAL/PLATELET - Abnormal; Notable for the following components:   WBC 28.8 (*)    Neutro Abs 23.4 (*)    Monocytes Absolute 2.6 (*)    Basophils Absolute 0.2 (*)    Abs Immature Granulocytes 1.38 (*)    All other components within normal limits  RAPID URINE DRUG SCREEN, HOSP PERFORMED    EKG None  Radiology DG Chest Port 1 View  Result Date: 12/09/2020 CLINICAL DATA:  Dyspnea after overdose. EXAM: PORTABLE CHEST 1 VIEW COMPARISON:  June 03, 2018 FINDINGS: The heart size and mediastinal contours are within normal limits. Both lungs are clear. The visualized skeletal structures are unremarkable. IMPRESSION: No active disease. Electronically Signed   By: Ted Mcalpine M.D.   On: 12/09/2020 11:12    Procedures Procedures   Medications Ordered in ED Medications  naloxone [facility]) 2 MG/2ML injection (1 mg  Given 12/09/20 1005)  sodium chloride 0.9 % bolus 1,000 mL (1,000 mLs Intravenous New Bag/Given 12/09/20 1017)    ED Course  I have reviewed the triage vital signs and the nursing notes.  Pertinent labs & imaging results that were available during my care of the patient were reviewed by me and considered in my medical decision making (see chart for details).    MDM Rules/Calculators/A&P                          MDM  MSE complete  Edward Osborn was evaluated in Emergency Department on 12/09/2020 for the symptoms described in the history of present illness. He was evaluated in the context of the global COVID-19 pandemic, which necessitated consideration that the patient might be at risk for  infection with the SARS-CoV-2 virus that causes COVID-19. Institutional protocols and algorithms that pertain to the evaluation of patients at risk for COVID-19 are in a state of rapid change based on information released by regulatory bodies including the CDC and federal and state organizations. These policies and algorithms were followed during the patient's care in the ED.   Patient is presenting after presumed OD.  Patient admits to drinking alcohol and "smoking crack".  Patient's mental status did improve with administration of Narcan.  I suspect that the patient was using narcotics in some form.  Alcohol level is noted to be 43.  Other screening labs are without significant abnormality.   Patient is improved after observation and treatment in the ED.  He understands need for outpatient treatment of his addiction.  He understands that avoiding narcotics could be potentially life-saving.  Importance of close follow-up stressed.  Strict return precautions given and understood.   Final Clinical Impression(s) / ED Diagnoses Final diagnoses:  Polysubstance abuse (HCC)    Rx / DC Orders ED Discharge Orders         Ordered    naloxone [facility]) nasal spray 4 mg/0.1 mL        12/09/20 1430           Wynetta Fines, MD 12/09/20 1407    Wynetta Fines, MD 12/09/20 1433

## 2022-01-06 ENCOUNTER — Emergency Department (HOSPITAL_COMMUNITY)
Admission: EM | Admit: 2022-01-06 | Discharge: 2022-01-06 | Disposition: A | Discharge status: Home / Self Care | Payer: 59 | Attending: Emergency Medicine | Admitting: Emergency Medicine

## 2022-01-06 ENCOUNTER — Emergency Department (HOSPITAL_COMMUNITY): Payer: 59

## 2022-01-06 ENCOUNTER — Encounter (HOSPITAL_COMMUNITY): Payer: Self-pay

## 2022-01-06 ENCOUNTER — Other Ambulatory Visit: Payer: Self-pay

## 2022-01-06 DIAGNOSIS — I10 Essential (primary) hypertension: Secondary | ICD-10-CM | POA: Insufficient documentation

## 2022-01-06 DIAGNOSIS — T424X1A Poisoning by benzodiazepines, accidental (unintentional), initial encounter: Secondary | ICD-10-CM | POA: Diagnosis present

## 2022-01-06 DIAGNOSIS — F1910 Other psychoactive substance abuse, uncomplicated: Secondary | ICD-10-CM | POA: Insufficient documentation

## 2022-01-06 LAB — CBC WITH DIFFERENTIAL/PLATELET
Abs Immature Granulocytes: 0.05 10*3/uL (ref 0.00–0.07)
Basophils Absolute: 0 10*3/uL (ref 0.0–0.1)
Basophils Relative: 1 %
Eosinophils Absolute: 0.2 10*3/uL (ref 0.0–0.5)
Eosinophils Relative: 2 %
HCT: 45.1 % (ref 39.0–52.0)
Hemoglobin: 14.5 g/dL (ref 13.0–17.0)
Immature Granulocytes: 1 %
Lymphocytes Relative: 28 %
Lymphs Abs: 2.4 10*3/uL (ref 0.7–4.0)
MCH: 28.5 pg (ref 26.0–34.0)
MCHC: 32.2 g/dL (ref 30.0–36.0)
MCV: 88.8 fL (ref 80.0–100.0)
Monocytes Absolute: 0.8 10*3/uL (ref 0.1–1.0)
Monocytes Relative: 9 %
Neutro Abs: 5.2 10*3/uL (ref 1.7–7.7)
Neutrophils Relative %: 59 %
Platelets: 326 10*3/uL (ref 150–400)
RBC: 5.08 MIL/uL (ref 4.22–5.81)
RDW: 14 % (ref 11.5–15.5)
WBC: 8.7 10*3/uL (ref 4.0–10.5)
nRBC: 0 % (ref 0.0–0.2)

## 2022-01-06 LAB — HEPATIC FUNCTION PANEL
ALT: 15 U/L (ref 0–44)
AST: 16 U/L (ref 15–41)
Albumin: 3.9 g/dL (ref 3.5–5.0)
Alkaline Phosphatase: 65 U/L (ref 38–126)
Bilirubin, Direct: 0.1 mg/dL (ref 0.0–0.2)
Indirect Bilirubin: 0.6 mg/dL (ref 0.3–0.9)
Total Bilirubin: 0.7 mg/dL (ref 0.3–1.2)
Total Protein: 7.5 g/dL (ref 6.5–8.1)

## 2022-01-06 LAB — BASIC METABOLIC PANEL
Anion gap: 6 (ref 5–15)
BUN: 15 mg/dL (ref 6–20)
CO2: 27 mmol/L (ref 22–32)
Calcium: 8.9 mg/dL (ref 8.9–10.3)
Chloride: 104 mmol/L (ref 98–111)
Creatinine, Ser: 0.84 mg/dL (ref 0.61–1.24)
GFR, Estimated: >60 mL/min (ref >60)
Glucose, Bld: 115 mg/dL — ABNORMAL HIGH (ref 70–99)
Potassium: 3.5 mmol/L (ref 3.5–5.1)
Sodium: 137 mmol/L (ref 135–145)

## 2022-01-06 LAB — TROPONIN I (HIGH SENSITIVITY)
Troponin I (High Sensitivity): 3 ng/L (ref <18)
Troponin I (High Sensitivity): 4 ng/L (ref <18)

## 2022-01-06 LAB — RAPID URINE DRUG SCREEN, HOSP PERFORMED
Amphetamines: POSITIVE — AB
Barbiturates: NOT DETECTED
Benzodiazepines: POSITIVE — AB
Cocaine: POSITIVE — AB
Opiates: NOT DETECTED
Tetrahydrocannabinol: POSITIVE — AB

## 2022-01-06 LAB — URINALYSIS, ROUTINE W REFLEX MICROSCOPIC
Bacteria, UA: NONE SEEN
Bilirubin Urine: NEGATIVE
Glucose, UA: NEGATIVE mg/dL
Hgb urine dipstick: NEGATIVE
Ketones, ur: NEGATIVE mg/dL
Nitrite: NEGATIVE
Protein, ur: NEGATIVE mg/dL
Specific Gravity, Urine: 1.03 (ref 1.005–1.030)
pH: 5 (ref 5.0–8.0)

## 2022-01-06 LAB — ETHANOL: Alcohol, Ethyl (B): <10 mg/dL (ref <10)

## 2022-01-06 LAB — ACETAMINOPHEN LEVEL: Acetaminophen (Tylenol), Serum: <10 ug/mL — ABNORMAL LOW (ref 10–30)

## 2022-01-06 LAB — LIPASE, BLOOD: Lipase: 22 U/L (ref 11–51)

## 2022-01-06 LAB — SALICYLATE LEVEL: Salicylate Lvl: <7 mg/dL — ABNORMAL LOW (ref 7.0–30.0)

## 2022-01-06 MED ORDER — NALOXONE HCL 4 MG/0.1ML NA LIQD: NASAL | 0 refills | Status: DC

## 2022-01-06 NOTE — ED Provider Notes (Signed)
[facility]-EMERGENCY DEPT Provider Note   CSN: [id number] Arrival date & time: 01/06/22  9323     History  Chief Complaint  Patient presents with   Drug Overdose    Edward Osborn is a 41 y.o. male.  Patient presents to the emergency department via EMS due to possible overdose.  Patient was found unresponsive in a vehicle at a gas station.  Patient admitted to cocaine use to EMS and denied other drugs.  EMS states that crack and Xanax were found in the vehicle on scene.  Passenger in vehicle required bag valve mask ventilations.  Patient did not require Narcan, able to maintain own airway.  Upon my assessment patient is somewhat somnolent and is reluctant to answer questions.  I asked the patient had used heroin and narcotics and the patient states "I guess so".  Past medical history significant for GERD and hypertension  HPI     Home Medications Prior to Admission medications   Medication Sig Start Date End Date Taking? Authorizing Provider  aspirin-acetaminophen-caffeine (EXCEDRIN MIGRAINE) [phone] MG tablet Take 1 tablet by mouth every 6 (six) hours as needed for headache.    [provider]  naloxone [facility]) nasal spray 4 mg/0.1 mL Spray into nose if overdosed on narcotic. 01/06/22   Darrick Grinder, PA-C  escitalopram (LEXAPRO) 20 MG tablet Take 20 mg by mouth daily.  01/26/20  [provider]      Allergies    Amoxicillin-pot clavulanate    Review of Systems   Review of Systems  Respiratory:  Negative for shortness of breath.   Cardiovascular:  Negative for chest pain.  Gastrointestinal:  Negative for abdominal pain.  Neurological:  Positive for headaches.    Physical Exam Updated Vital Signs BP 138/82   Pulse 60   Temp (!) 97.5 F (36.4 C) (Oral)   Resp 11   Ht 5\' 8"  (1.727 m)   Wt 87.5 kg   SpO2 100%   BMI 29.35 kg/m  Physical Exam Vitals and nursing note reviewed.  Constitutional:      General: He is not in  acute distress.    Appearance: He is normal weight.  HENT:     Head: Normocephalic and atraumatic.     Mouth/Throat:     Mouth: Mucous membranes are moist.  Eyes:     Conjunctiva/sclera: Conjunctivae normal.     Comments: Patient with bilateral pinpoint pupils  Cardiovascular:     Rate and Rhythm: Normal rate and regular rhythm.     Pulses: Normal pulses.     Heart sounds: Normal heart sounds.  Pulmonary:     Effort: Pulmonary effort is normal.     Breath sounds: Normal breath sounds.     Comments: No respiratory depression Abdominal:     General: Abdomen is flat.     Tenderness: There is no abdominal tenderness.  Musculoskeletal:        General: Normal range of motion.     Cervical back: Normal range of motion and neck supple.  Skin:    General: Skin is warm and dry.     Capillary Refill: Capillary refill takes less than 2 seconds.  Neurological:     Mental Status: He is alert and oriented to person, place, and time.     ED Results / Procedures / Treatments   Labs (all labs ordered are listed, but only abnormal results are displayed) Labs Reviewed  BASIC METABOLIC PANEL - Abnormal; Notable for the following components:  Result Value   Glucose, Bld 115 (*)    All other components within normal limits  RAPID URINE DRUG SCREEN, HOSP PERFORMED - Abnormal; Notable for the following components:   Cocaine POSITIVE (*)    Benzodiazepines POSITIVE (*)    Amphetamines POSITIVE (*)    Tetrahydrocannabinol POSITIVE (*)    All other components within normal limits  URINALYSIS, ROUTINE W REFLEX MICROSCOPIC - Abnormal; Notable for the following components:   Leukocytes,Ua TRACE (*)    All other components within normal limits  ACETAMINOPHEN LEVEL - Abnormal; Notable for the following components:   Acetaminophen (Tylenol), Serum <10 (*)    All other components within normal limits  SALICYLATE LEVEL - Abnormal; Notable for the following components:   Salicylate Lvl <7.0 (*)     All other components within normal limits  CBC WITH DIFFERENTIAL/PLATELET  ETHANOL  HEPATIC FUNCTION PANEL  LIPASE, BLOOD  TROPONIN I (HIGH SENSITIVITY)  TROPONIN I (HIGH SENSITIVITY)    EKG EKG Interpretation  Date/Time:  Thursday January 06 2022 07:23:17 EDT Ventricular Rate:  73 PR Interval:  135 QRS Duration: 113 QT Interval:  428 QTC Calculation: 472 R Axis:   64 Text Interpretation: Sinus rhythm Left ventricular hypertrophy No significant change was found Confirmed by Glynn Octave [phone]) on 01/06/2022 8:30:00 AM  Radiology CT Head Wo Contrast  Result Date: 01/06/2022 CLINICAL DATA:  Mental status change, unknown cause EXAM: CT HEAD WITHOUT CONTRAST TECHNIQUE: Contiguous axial images were obtained from the base of the skull through the vertex without intravenous contrast. RADIATION DOSE REDUCTION: This exam was performed according to the departmental dose-optimization program which includes automated exposure control, adjustment of the mA and/or kV according to patient size and/or use of iterative reconstruction technique. COMPARISON:  None Available. FINDINGS: Brain: No acute intracranial hemorrhage or mass effect. Age-indeterminate infarction of the left basal ganglia and adjacent white matter. No hydrocephalus or extra-axial collection. Vascular: No hyperdense vessel or unexpected calcification. Skull: Calvarium is unremarkable. Sinuses/Orbits: Paranasal sinus mucosal thickening. Unremarkable orbits. Other: Mastoid air cells are clear. IMPRESSION: No acute intracranial hemorrhage. 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 Electronically Signed   By: Guadlupe Spanish M.D.   On: 01/06/2022 12:41    Procedures Procedures    Medications Ordered in ED Medications - No data to display  ED Course/ Medical Decision Making/ A&P                           Medical Decision Making Amount and/or Complexity of Data  Reviewed Labs: ordered. Radiology: ordered.   Patient presents to the emergency department after an apparent overdose.  Patient with altered mental status and pinpoint pupils suggestive of opiate use.  The patient is breathing 16 times per minute and maintaining 95% on room air.  Do not feel that Narcan is necessary at this time.  I ordered and reviewed lab results.  Pertinent results include normal hepatic function panel, normal CBC, glucose 115, normal acetaminophen and salicylate levels, ethanol less than 10, lipase 22, troponin 3, repeat troponin 4, UDS with positive cocaine, benzodiazepine, amphetamine, THC results, grossly normal urinalysis  I ordered an EKG which showed an underlying rhythm of sinus rhythm.  I ordered and interpreted imaging including CT head.  No acute intracranial hemorrhage or abnormality.  I agree with the radiologist findings.  The patient was allowed to rest to metabolize.  Upon reassessment the patient is alert and oriented.  Patient states that he took what he thought was Xanax this morning.  Patient states it was a "homemade" pressing that a friend provided.  Patient is able to eat and ambulate without difficulty at this time.  Patient denies any suicidal ideation or intent to harm self.  Feel that it is safe to discharge patient home.  I will provide a new prescription for Narcan at discharge.        Final Clinical Impression(s) / ED Diagnoses Final diagnoses:  Xanax overdose  Polysubstance abuse (HCC)    Rx / DC Orders ED Discharge Orders          Ordered    naloxone [facility]) nasal spray 4 mg/0.1 mL        01/06/22 1359              Pamala Duffel 01/06/22 1409    Glynn Octave, MD 01/06/22 1555    Glynn Octave, MD 01/06/22 1654

## 2022-01-06 NOTE — ED Notes (Signed)
Pt states understanding of dc instructions, importance of follow up, and prescription. Pt denies questions or concerns upon dc. Pt declined wheelchair assistance upon dc. Pt ambulated out of ed w/ steady gait. No belongings left in room upon dc.  

## 2022-01-06 NOTE — ED Triage Notes (Signed)
Pt bib ems for od. Pt was found unresponsive in vehicle at gas station.  Per ems, pt admits to cocaine, but crack and xanax was also found in vehicle on scene. Pt vitally stable for ems, no narcan given by ems, pt able to maintain own airway.  Pt sating 95% on RA upon arrival to ed.  Pt c/o headache.  CBG 111 per ems.

## 2022-01-06 NOTE — Discharge Instructions (Addendum)
You were seen today due to an apparent xanax overdose. Your presentation was also concerning for possible opiate use. I have prescribed a Narcan kit to be used if needed if there is an opiate overdose.  I have also attached information on resources for substance abuse treatment.

## 2022-03-31 ENCOUNTER — Encounter (HOSPITAL_BASED_OUTPATIENT_CLINIC_OR_DEPARTMENT_OTHER): Payer: Self-pay

## 2022-03-31 ENCOUNTER — Observation Stay (HOSPITAL_BASED_OUTPATIENT_CLINIC_OR_DEPARTMENT_OTHER)
Admission: EM | Admit: 2022-03-31 | Discharge: 2022-04-02 | Disposition: A | Discharge status: Home / Self Care | Payer: 59 | Attending: Internal Medicine | Admitting: Internal Medicine

## 2022-03-31 ENCOUNTER — Emergency Department (HOSPITAL_BASED_OUTPATIENT_CLINIC_OR_DEPARTMENT_OTHER): Payer: 59

## 2022-03-31 DIAGNOSIS — F1490 Cocaine use, unspecified, uncomplicated: Secondary | ICD-10-CM

## 2022-03-31 DIAGNOSIS — Z7982 Long term (current) use of aspirin: Secondary | ICD-10-CM | POA: Insufficient documentation

## 2022-03-31 DIAGNOSIS — R8290 Unspecified abnormal findings in urine: Secondary | ICD-10-CM | POA: Diagnosis not present

## 2022-03-31 DIAGNOSIS — K859 Acute pancreatitis without necrosis or infection, unspecified: Principal | ICD-10-CM | POA: Diagnosis present

## 2022-03-31 DIAGNOSIS — I10 Essential (primary) hypertension: Secondary | ICD-10-CM | POA: Insufficient documentation

## 2022-03-31 DIAGNOSIS — R030 Elevated blood-pressure reading, without diagnosis of hypertension: Secondary | ICD-10-CM

## 2022-03-31 DIAGNOSIS — Z79899 Other long term (current) drug therapy: Secondary | ICD-10-CM | POA: Diagnosis not present

## 2022-03-31 DIAGNOSIS — F17210 Nicotine dependence, cigarettes, uncomplicated: Secondary | ICD-10-CM | POA: Insufficient documentation

## 2022-03-31 DIAGNOSIS — D72829 Elevated white blood cell count, unspecified: Secondary | ICD-10-CM

## 2022-03-31 DIAGNOSIS — R1013 Epigastric pain: Secondary | ICD-10-CM | POA: Diagnosis present

## 2022-03-31 LAB — URINALYSIS, ROUTINE W REFLEX MICROSCOPIC
Bilirubin Urine: NEGATIVE
Glucose, UA: NEGATIVE mg/dL
Ketones, ur: NEGATIVE mg/dL
Nitrite: NEGATIVE
Specific Gravity, Urine: 1.012 (ref 1.005–1.030)
WBC, UA: >50 WBC/hpf — ABNORMAL HIGH (ref 0–5)
pH: 7 (ref 5.0–8.0)

## 2022-03-31 LAB — CBC
HCT: 45.9 % (ref 39.0–52.0)
Hemoglobin: 14.8 g/dL (ref 13.0–17.0)
MCH: 27.4 pg (ref 26.0–34.0)
MCHC: 32.2 g/dL (ref 30.0–36.0)
MCV: 84.8 fL (ref 80.0–100.0)
Platelets: 296 10*3/uL (ref 150–400)
RBC: 5.41 MIL/uL (ref 4.22–5.81)
RDW: 13.5 % (ref 11.5–15.5)
WBC: 15.5 10*3/uL — ABNORMAL HIGH (ref 4.0–10.5)
nRBC: 0 % (ref 0.0–0.2)

## 2022-03-31 LAB — COMPREHENSIVE METABOLIC PANEL
ALT: 25 U/L (ref 0–44)
AST: 34 U/L (ref 15–41)
Albumin: 4.1 g/dL (ref 3.5–5.0)
Alkaline Phosphatase: 65 U/L (ref 38–126)
Anion gap: 8 (ref 5–15)
BUN: 8 mg/dL (ref 6–20)
CO2: 28 mmol/L (ref 22–32)
Calcium: 9.9 mg/dL (ref 8.9–10.3)
Chloride: 99 mmol/L (ref 98–111)
Creatinine, Ser: 0.96 mg/dL (ref 0.61–1.24)
GFR, Estimated: >60 mL/min (ref >60)
Glucose, Bld: 154 mg/dL — ABNORMAL HIGH (ref 70–99)
Potassium: 4.1 mmol/L (ref 3.5–5.1)
Sodium: 135 mmol/L (ref 135–145)
Total Bilirubin: 1 mg/dL (ref 0.3–1.2)
Total Protein: 7.7 g/dL (ref 6.5–8.1)

## 2022-03-31 LAB — LIPASE, BLOOD: Lipase: 661 U/L — ABNORMAL HIGH (ref 11–51)

## 2022-03-31 MED ORDER — MORPHINE SULFATE (PF) 4 MG/ML IV SOLN
4.0000 mg | Freq: Once | INTRAVENOUS | Status: AC
  Administered 2022-04-01: 4 mg via INTRAVENOUS
  Filled 2022-03-31: qty 1

## 2022-03-31 MED ORDER — IOHEXOL 300 MG/ML  SOLN
80.0000 mL | Freq: Once | INTRAMUSCULAR | Status: AC | PRN
  Administered 2022-03-31: 80 mL via INTRAVENOUS

## 2022-03-31 MED ORDER — ONDANSETRON HCL 4 MG/2ML IJ SOLN
4.0000 mg | Freq: Once | INTRAMUSCULAR | Status: AC
  Administered 2022-04-01: 4 mg via INTRAVENOUS
  Filled 2022-03-31: qty 2

## 2022-03-31 MED ORDER — LACTATED RINGERS IV BOLUS
1000.0000 mL | Freq: Once | INTRAVENOUS | Status: AC
  Administered 2022-04-01: 1000 mL via INTRAVENOUS

## 2022-03-31 NOTE — ED Notes (Signed)
ED Provider at bedside. 

## 2022-03-31 NOTE — ED Triage Notes (Signed)
Abdominal pain started 2 days ago States he vomited coffee ground emesis yesterday, none today. + nausea Also reports dizziness

## 2022-03-31 NOTE — ED Provider Notes (Signed)
[place] EMERGENCY DEPT Provider Note   CSN: [id number] Arrival date & time: 03/31/22  2038     History  Chief Complaint  Patient presents with   Abdominal Pain    Edward Osborn is a 41 y.o. male.  The history is provided by the patient and medical records.  Abdominal Pain Edward Osborn is a 41 y.o. male who presents to the Emergency Department complaining of abdominal pain.  He presents to the ED for evaluation of sharp, epigastric abdominal pain that started two days ago.  Pain is constant in nature, occasional radiates to the left side.  Feels prior to peptic ulcer that was treated with omeprazole - does not currently take.    Subjective fever.  Vomited black material two days ago (3-4 episodes).  Has nausea and decreased appetite.  No diarrhea, constipation.  Last BM Monday or Tuesday.  No dysuria.    Smokes tobacco. Rare alcohol.  Rare marijuana.    Hx/o appendectomy.       Home Medications Prior to Admission medications   Medication Sig Start Date End Date Taking? Authorizing Provider  aspirin-acetaminophen-caffeine (EXCEDRIN MIGRAINE) [phone] MG tablet Take 1 tablet by mouth every 6 (six) hours as needed for headache.    [provider]  naloxone [facility]) nasal spray 4 mg/0.1 mL Spray into nose if overdosed on narcotic. 01/06/22   Dorothyann Peng, PA-C  escitalopram (LEXAPRO) 20 MG tablet Take 20 mg by mouth daily.  01/26/20  [provider]      Allergies    Amoxicillin-pot clavulanate    Review of Systems   Review of Systems  Gastrointestinal:  Positive for abdominal pain.  All other systems reviewed and are negative.   Physical Exam Updated Vital Signs BP (!) 181/111   Pulse 91   Temp 99.4 F (37.4 C) (Oral)   Resp 20   Ht 5\' 8"  (1.727 m)   Wt 86.2 kg   SpO2 100%   BMI 28.89 kg/m  Physical Exam Vitals and nursing note reviewed.  Constitutional:      Appearance: He is well-developed.  HENT:      Head: Normocephalic and atraumatic.  Cardiovascular:     Rate and Rhythm: Normal rate and regular rhythm.     Heart sounds: No murmur heard. Pulmonary:     Effort: Pulmonary effort is normal. No respiratory distress.     Breath sounds: Normal breath sounds.  Abdominal:     Tenderness: There is abdominal tenderness. There is guarding.     Comments: Generalized abdominal tenderness  Musculoskeletal:        General: No tenderness.  Skin:    General: Skin is warm and dry.  Neurological:     Mental Status: He is alert and oriented to person, place, and time.  Psychiatric:        Behavior: Behavior normal.     ED Results / Procedures / Treatments   Labs (all labs ordered are listed, but only abnormal results are displayed) Labs Reviewed  LIPASE, BLOOD - Abnormal; Notable for the following components:      Result Value   Lipase 661 (*)    All other components within normal limits  COMPREHENSIVE METABOLIC PANEL - Abnormal; Notable for the following components:   Glucose, Bld 154 (*)    All other components within normal limits  CBC - Abnormal; Notable for the following components:   WBC 15.5 (*)    All other components within normal limits  URINALYSIS, ROUTINE  W REFLEX MICROSCOPIC - Abnormal; Notable for the following components:   Hgb urine dipstick TRACE (*)    Protein, ur TRACE (*)    Leukocytes,Ua MODERATE (*)    WBC, UA >50 (*)    Bacteria, UA RARE (*)    All other components within normal limits  URINE CULTURE    EKG EKG Interpretation  Date/Time:  Thursday March 31 2022 21:16:37 EDT Ventricular Rate:  105 PR Interval:  130 QRS Duration: 88 QT Interval:  344 QTC Calculation: 454 R Axis:   65 Text Interpretation: Sinus tachycardia Possible Left atrial enlargement Minimal voltage criteria for LVH, may be normal variant ( Sokolow-Lyon ) Nonspecific T wave abnormality Abnormal ECG When compared with ECG of 06-Jan-2022 07:23, increased rate now Confirmed by Aletta Edouard [phone]) on 03/31/2022 9:22:05 PM  Radiology CT Abdomen Pelvis W Contrast  Addendum Date: 04/01/2022   ADDENDUM REPORT: 04/01/2022 00:38 ADDENDUM: Addendum to add findings in the lower lungs to impression. Infectious/inflammatory ground-glass opacities in the right lower lobe and lingula. Electronically Signed   By: Placido Sou M.D.   On: 04/01/2022 00:38   Result Date: 04/01/2022 CLINICAL DATA:  Acute severe abdominal pain since 2 days ago with coffee-ground emesis EXAM: CT ABDOMEN AND PELVIS WITH CONTRAST TECHNIQUE: Multidetector CT imaging of the abdomen and pelvis was performed using the standard protocol following bolus administration of intravenous contrast. RADIATION DOSE REDUCTION: This exam was performed according to the departmental dose-optimization program which includes automated exposure control, adjustment of the mA and/or kV according to patient size and/or use of iterative reconstruction technique. CONTRAST:  31mL OMNIPAQUE IOHEXOL 300 MG/ML  SOLN COMPARISON:  None available FINDINGS: Lower chest: Patchy ground-glass opacities in the right lower lobe and to a lesser extent the lingula compatible with infection. Hepatobiliary: Hypoattenuating areas along the falciform ligament and inferior right hepatic lobe compatible with focal fat deposition. Unremarkable gallbladder. No biliary dilation. Pancreas: Mild edema within the pancreas. No significant peripancreatic fluid or stranding. No ductal dilation Spleen: Normal in size without focal abnormality. Adrenals/Urinary Tract: Unremarkable adrenal glands. Tiny nonobstructing right nephrolithiasis. No hydronephrosis. Unremarkable bladder. Stomach/Bowel: Stomach is within normal limits. Appendix appears normal. No evidence of bowel wall thickening, distention, or inflammatory changes. Colonic diverticulosis without diverticulitis. Vascular/Lymphatic: No significant vascular findings are present. No enlarged abdominal or pelvic lymph nodes.  Reproductive: Prostate is unremarkable. Other: No free intraperitoneal fluid or air. Musculoskeletal: No acute or significant osseous findings. IMPRESSION: Mild edema of the pancreas compatible with pancreatitis. No significant peripancreatic fluid. 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 Electronically Signed: By: Placido Sou M.D. On: 04/01/2022 00:25    Procedures Procedures    Medications Ordered in ED Medications  lactated ringers infusion ( Intravenous New Bag/Given 04/01/22 0215)  labetalol (NORMODYNE) injection 20 mg (has no administration in time range)  morphine (PF) 4 MG/ML injection 4 mg (4 mg Intravenous Given 04/01/22 0016)  ondansetron (ZOFRAN) injection 4 mg (4 mg Intravenous Given 04/01/22 0015)  lactated ringers bolus 1,000 mL (0 mLs Intravenous Stopped 04/01/22 0125)  iohexol (OMNIPAQUE) 300 MG/ML solution 80 mL (80 mLs Intravenous Contrast Given 03/31/22 2359)  labetalol (NORMODYNE) injection 10 mg (10 mg Intravenous Given 04/01/22 0330)    ED Course/ Medical Decision Making/ A&P                           Medical Decision Making Amount and/or Complexity of Data Reviewed Labs: ordered. Radiology: ordered.  Risk Prescription drug management. Decision regarding hospitalization.   Patient with remote history of peptic ulcer here for evaluation of sharp abdominal pain.  He has generalized tenderness on examination, appears dehydrated.  Labs significant for elevated lipase, leukocytosis.  CT abdomen pelvis is consistent with pancreatitis, possibly autoimmune.  CT also demonstrates possible right lower lobe infiltrate-patient without respiratory symptoms, will not treat for infection at this time.  UA with leukocytes present but patient without current urinary symptoms-will send culture but not treat at present.  Patient was treated with IV fluids, pain medications as well as antihypertensives for his elevated blood pressure.  Patient's pain is  significantly improved on repeat evaluation.  Discussed with patient recommendation for admission and he is in agreement with treatment plan.  Hospitalist consulted for admission.        Final Clinical Impression(s) / ED Diagnoses Final diagnoses:  Acute pancreatitis without infection or necrosis, unspecified pancreatitis type    Rx / DC Orders ED Discharge Orders     None         Quintella Reichert, MD 04/01/22 [phone]

## 2022-04-01 ENCOUNTER — Other Ambulatory Visit: Payer: Self-pay

## 2022-04-01 ENCOUNTER — Emergency Department (HOSPITAL_BASED_OUTPATIENT_CLINIC_OR_DEPARTMENT_OTHER): Payer: 59

## 2022-04-01 ENCOUNTER — Encounter (HOSPITAL_COMMUNITY): Payer: Self-pay

## 2022-04-01 ENCOUNTER — Observation Stay (HOSPITAL_COMMUNITY): Payer: 59

## 2022-04-01 DIAGNOSIS — K859 Acute pancreatitis without necrosis or infection, unspecified: Secondary | ICD-10-CM | POA: Diagnosis present

## 2022-04-01 DIAGNOSIS — D72829 Elevated white blood cell count, unspecified: Secondary | ICD-10-CM

## 2022-04-01 DIAGNOSIS — R8290 Unspecified abnormal findings in urine: Secondary | ICD-10-CM

## 2022-04-01 DIAGNOSIS — Z79899 Other long term (current) drug therapy: Secondary | ICD-10-CM | POA: Diagnosis not present

## 2022-04-01 DIAGNOSIS — F1490 Cocaine use, unspecified, uncomplicated: Secondary | ICD-10-CM | POA: Diagnosis not present

## 2022-04-01 DIAGNOSIS — K858 Other acute pancreatitis without necrosis or infection: Secondary | ICD-10-CM

## 2022-04-01 DIAGNOSIS — I10 Essential (primary) hypertension: Secondary | ICD-10-CM | POA: Diagnosis not present

## 2022-04-01 DIAGNOSIS — Z7982 Long term (current) use of aspirin: Secondary | ICD-10-CM | POA: Diagnosis not present

## 2022-04-01 DIAGNOSIS — R030 Elevated blood-pressure reading, without diagnosis of hypertension: Secondary | ICD-10-CM

## 2022-04-01 DIAGNOSIS — R1013 Epigastric pain: Secondary | ICD-10-CM | POA: Diagnosis present

## 2022-04-01 DIAGNOSIS — F17210 Nicotine dependence, cigarettes, uncomplicated: Secondary | ICD-10-CM | POA: Diagnosis not present

## 2022-04-01 LAB — RAPID URINE DRUG SCREEN, HOSP PERFORMED
Amphetamines: POSITIVE — AB
Barbiturates: NOT DETECTED
Benzodiazepines: NOT DETECTED
Cocaine: POSITIVE — AB
Opiates: POSITIVE — AB
Tetrahydrocannabinol: POSITIVE — AB

## 2022-04-01 LAB — HIV ANTIBODY (ROUTINE TESTING W REFLEX): HIV Screen 4th Generation wRfx: NONREACTIVE

## 2022-04-01 LAB — CBC
HCT: 43.6 % (ref 39.0–52.0)
Hemoglobin: 14.5 g/dL (ref 13.0–17.0)
MCH: 28 pg (ref 26.0–34.0)
MCHC: 33.3 g/dL (ref 30.0–36.0)
MCV: 84.3 fL (ref 80.0–100.0)
Platelets: 314 10*3/uL (ref 150–400)
RBC: 5.17 MIL/uL (ref 4.22–5.81)
RDW: 13.2 % (ref 11.5–15.5)
WBC: 14.1 10*3/uL — ABNORMAL HIGH (ref 4.0–10.5)
nRBC: 0 % (ref 0.0–0.2)

## 2022-04-01 LAB — LIPASE, BLOOD: Lipase: 38 U/L (ref 11–51)

## 2022-04-01 MED ORDER — LABETALOL HCL 5 MG/ML IV SOLN: 10.0000 mg | Freq: Once | INTRAVENOUS | Status: DC | PRN

## 2022-04-01 MED ORDER — LABETALOL HCL 5 MG/ML IV SOLN
10.0000 mg | Freq: Once | INTRAVENOUS | Status: AC
  Administered 2022-04-01: 10 mg via INTRAVENOUS
  Filled 2022-04-01: qty 4

## 2022-04-01 MED ORDER — ENOXAPARIN SODIUM 40 MG/0.4ML IJ SOSY
40.0000 mg | PREFILLED_SYRINGE | INTRAMUSCULAR | Status: DC
  Administered 2022-04-01: 40 mg via SUBCUTANEOUS
  Filled 2022-04-01: qty 0.4

## 2022-04-01 MED ORDER — HYDRALAZINE HCL 20 MG/ML IJ SOLN: 10.0000 mg | Freq: Three times a day (TID) | INTRAMUSCULAR | Status: DC | PRN

## 2022-04-01 MED ORDER — LABETALOL HCL 5 MG/ML IV SOLN
20.0000 mg | Freq: Once | INTRAVENOUS | Status: DC
  Filled 2022-04-01: qty 4

## 2022-04-01 MED ORDER — HYDROMORPHONE HCL 1 MG/ML IJ SOLN
1.0000 mg | INTRAMUSCULAR | Status: DC | PRN
  Administered 2022-04-01: 1 mg via INTRAVENOUS
  Filled 2022-04-01: qty 1

## 2022-04-01 MED ORDER — LACTATED RINGERS IV SOLN: INTRAVENOUS | Status: DC

## 2022-04-01 MED ORDER — MORPHINE SULFATE (PF) 2 MG/ML IV SOLN
2.0000 mg | INTRAVENOUS | Status: DC | PRN
  Administered 2022-04-01: 2 mg via INTRAVENOUS
  Filled 2022-04-01: qty 1

## 2022-04-01 MED ORDER — MORPHINE SULFATE (PF) 4 MG/ML IV SOLN
4.0000 mg | Freq: Once | INTRAVENOUS | Status: AC
  Administered 2022-04-01: 4 mg via INTRAVENOUS
  Filled 2022-04-01: qty 1

## 2022-04-01 MED ORDER — LABETALOL HCL 5 MG/ML IV SOLN
20.0000 mg | Freq: Once | INTRAVENOUS | Status: AC | PRN
  Administered 2022-04-01: 20 mg via INTRAVENOUS
  Filled 2022-04-01: qty 4

## 2022-04-01 NOTE — Assessment & Plan Note (Signed)
History of HTN in the past, but lost significant amount of weight and states has not had an issue Could be pain vs. HTN Hydralazine PRN and pain control SW consult for PCP

## 2022-04-01 NOTE — ED Notes (Signed)
PT RESTING NORMAL RISE AND FALL OF CHEST

## 2022-04-01 NOTE — Progress Notes (Signed)
Urine specimen collected and sent to lab at this time.

## 2022-04-01 NOTE — ED Notes (Signed)
Pt now returned from [place]; awake and alert sitting up in bed; no acute distress noted.

## 2022-04-01 NOTE — ED Notes (Signed)
Breaking primary RN for lunch at this time. Report called to [place] - Patient to be transferred to 6n24. Report given to Child psychotherapist at [facility]. Report given bedside to [facility]. No acute distress noted upon this RN's departure of Patient.

## 2022-04-01 NOTE — Assessment & Plan Note (Signed)
Asymptomatic with rare bacteria, likely dirty Culture pending

## 2022-04-01 NOTE — ED Notes (Signed)
Late entry -- Pt has removed nasal cannula -- O2 sats stable @98 % on RA -- will continue to monitor -- current bp 180/94  -- entered room to find pt had bp arm stretched back over his head during prior bp check -- educated to keep arm parallel to R side when bp cycling; pt acknowledges verbal understanding

## 2022-04-01 NOTE — ED Notes (Signed)
Pt sleeping re check BP

## 2022-04-01 NOTE — ED Notes (Signed)
Late entry -- Pt awake and alert - GCS 15.  Facial grimacing noted.  Pt reports ongoing epigastric pain at times radiating to LUQ rated 5/10 described as dull - pt reports when lying flat at CT pain did increase and felt like a stabbing pain.  Pt reports vomiting "a couple of days ago" - states only nauseated since then and reports decreased oral intake.  Pt denies h/o pancreatitis and denies current alcohol use; states unable to recall when last had alcoholic beverage.  Will monitor for acute changes and maintain plan of care.

## 2022-04-01 NOTE — Assessment & Plan Note (Signed)
41 year old male with sudden onset of epigastric pain with vomiting found to have elevated lipase >600 and findings on CT consistent with pancreatitis, suggestive of autoimmune pancreatitis -obs to med-surg -NPO x 2 days and pain nearly resolved and is hungry -start liquid diet -continue IVF -prn morphine -lipid panel in AM. Check UDS, nearly has stopped smoking. No other risk factors for pancreatitis. No known family history, no px drugs -check IgG 4 for autoimmune pancreatitis, if elevated likely needs steroids and GI consult -discussed with GI and okay to treat like regular pancreatitis for now and f/u on IgG 4 results.   -trend lipase

## 2022-04-01 NOTE — ED Notes (Signed)
Late entry -- Pt O2 desat to 85% on RA during resting state-  pt aroused with tactile stimulation - pt denies h/o sleep apnea; pt placed on 2L O2 via [place] with improvement to 99%

## 2022-04-01 NOTE — Assessment & Plan Note (Signed)
States he has not used in [place], UDS states otherwise.Marland Kitchen

## 2022-04-01 NOTE — ED Notes (Signed)
Pt aroused from resting state with light tactile stimulation -- able to obtain oral temp and start maintenance fluids- no acute changes noted - will continue to monitor for acute changes and maintain plan of care.

## 2022-04-01 NOTE — Progress Notes (Signed)
Informed pt of need for urine sample. Pt verbalizes understanding. Urinal and specimen cup left at bedside.

## 2022-04-01 NOTE — Assessment & Plan Note (Signed)
Likely reactive, no symptoms of infection UA appears dirty and he had no symptoms, but culture is pending CT with possible consolidation in lungs, 2V CXR pending. He has no s/s of pneumonia  Continue IVF, trend cbc and fever curve

## 2022-04-01 NOTE — ED Notes (Addendum)
Dr. Ralene Bathe notified of persistently elevated bp -- current bp 194/128 with h/o HTN.  Provider subsequently ordered 10mg  IVP Labetalol (see MAR for med administration) - pt has been placed on cardiac monitor- no chest pain/HA/dizziness/sob/blurry vision complaints verbalized.

## 2022-04-01 NOTE — H&P (Addendum)
History and Physical    Patient: Edward Osborn KZL:[id number] DOB: [birth year] DOA: 03/31/2022 DOS: the patient was seen and examined on 04/01/2022 PCP: System, Provider Not In  Patient coming from:  [facility]  - lives alone   Chief Complaint: epigastric pain   HPI: Edward Osborn is a 41 y.o. male with medical history significant of GERD and remote hx of HTN that presented to ED with complaints of epigastric pain. ON Tuesday he had a few episodes of coffee ground emesis. He had no pain at that time. He started to have epigastric pain on late Tuesday night/early Wednesday morning. ON thrusday at work pain became more severe and that night while he was laying down pain was so severe he went to ED. Pain mainly stayed in epigastric area, worse with lying flat. Sometimes radiates to left side. Pain 10/10 and described as stabbing/sharp pain in his stomach.    No known high TG, no family hx of pancreatitis. He does not smoke much at all, no prescription medication.     Denies any fever/chills, vision changes/headaches, chest pain or palpitations, shortness of breath or cough, dysuria or leg swelling.    He does smoke 1 cig/day and occasionally drinks alcohol. No recent drug use.   ER Course:  vitals: afebrile, bp: 174/121, HR: 116, RR: 25, oxygen: 98%RA Pertinent labs: wbc: 15.5, lipase: 661, UA: moderate LE, >50 wbc, rare bacteria CT abdomen: Mild edema of the pancreas compatible with pancreatitis. No significant peripancreatic fluid. 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 Infectious/inflammatory ground-glass opacities in the right lower lobe and lingula. In ED: given morphine, IVF and labetalol. TRH asked to admit    Review of Systems: As mentioned in the history of present illness. All other systems reviewed and are negative. Past Medical History:  Diagnosis Date   GERD (gastroesophageal reflux disease)    Hypertension    Past Surgical History:   Procedure Laterality Date   APPENDECTOMY     Social History:  reports that he has been smoking cigarettes. He has been smoking an average of .5 packs per day. He has never used smokeless tobacco. He reports current alcohol use. He reports that he does not currently use drugs after having used the following drugs: Cocaine.  Allergies  Allergen Reactions   Amoxicillin-Pot Clavulanate Nausea And Vomiting    Family History  Problem Relation Age of Onset   Cirrhosis Mother     Prior to Admission medications   Medication Sig Start Date End Date Taking? Authorizing Provider  aspirin-acetaminophen-caffeine (EXCEDRIN MIGRAINE) [phone] MG tablet Take 1 tablet by mouth every 6 (six) hours as needed for headache.    [provider]  naloxone [facility]) nasal spray 4 mg/0.1 mL Spray into nose if overdosed on narcotic. 01/06/22   Darrick Grinder, PA-C  escitalopram (LEXAPRO) 20 MG tablet Take 20 mg by mouth daily.  01/26/20  [provider]    Physical Exam: Vitals:   04/01/22 1230 04/01/22 1245 04/01/22 1415 04/01/22 1536  BP: (!) 175/120   (!) 164/96  Pulse: 86 89 86 79  Resp: 19 20 19    Temp:    97.7 F (36.5 C)  TempSrc:    Oral  SpO2: 98% 99% 99% 100%  Weight:      Height:       General:  Appears calm and comfortable and is in NAD Eyes:  PERRL, EOMI, normal lids, iris ENT:  grossly normal hearing, lips & tongue, mmm; appropriate dentition Neck:  no LAD, masses or thyromegaly; no carotid bruits Cardiovascular:  RRR, no m/r/g. No LE edema.  Respiratory:   CTA bilaterally with no wheezes/rales/rhonchi.  Normal respiratory effort. Abdomen:  soft, TTP in epigastric/RUQ, ND, NABS Back:   normal alignment, no CVAT Skin:  no rash or induration seen on limited exam Musculoskeletal:  grossly normal tone BUE/BLE, good ROM, no bony abnormality Lower extremity:  No LE edema.  Limited foot exam with no ulcerations.  2+ distal pulses. Psychiatric:  grossly normal mood and  affect, speech fluent and appropriate, AOx3 Neurologic:  CN 2-12 grossly intact, moves all extremities in coordinated fashion, sensation intact   Radiological Exams on Admission: Independently reviewed - see discussion in A/P where applicable  DG Chest 2 View  Result Date: 04/01/2022 CLINICAL DATA:  Evaluate for lung consolidation. EXAM: CHEST - 2 VIEW COMPARISON:  December 09, 2020 FINDINGS: The heart size and mediastinal contours are within normal limits. Mild patchy infiltrate is seen within the right lower lobe. There is no evidence of a pleural effusion or pneumothorax. The visualized skeletal structures are unremarkable. IMPRESSION: Mild right lower lobe infiltrate. Electronically Signed   By: Aram Candela M.D.   On: 04/01/2022 19:45   CT Abdomen Pelvis W Contrast  Addendum Date: 04/01/2022   ADDENDUM REPORT: 04/01/2022 00:38 ADDENDUM: Addendum to add findings in the lower lungs to impression. Infectious/inflammatory ground-glass opacities in the right lower lobe and lingula. Electronically Signed   By: Minerva Fester M.D.   On: 04/01/2022 00:38   Result Date: 04/01/2022 CLINICAL DATA:  Acute severe abdominal pain since 2 days ago with coffee-ground emesis EXAM: CT ABDOMEN AND PELVIS WITH CONTRAST TECHNIQUE: Multidetector CT imaging of the abdomen and pelvis was performed using the standard protocol following bolus administration of intravenous contrast. RADIATION DOSE REDUCTION: This exam was performed according to the departmental dose-optimization program which includes automated exposure control, adjustment of the mA and/or kV according to patient size and/or use of iterative reconstruction technique. CONTRAST:  38mL OMNIPAQUE IOHEXOL 300 MG/ML  SOLN COMPARISON:  None available FINDINGS: Lower chest: Patchy ground-glass opacities in the right lower lobe and to a lesser extent the lingula compatible with infection. Hepatobiliary: Hypoattenuating areas along the falciform ligament and  inferior right hepatic lobe compatible with focal fat deposition. Unremarkable gallbladder. No biliary dilation. Pancreas: Mild edema within the pancreas. No significant peripancreatic fluid or stranding. No ductal dilation Spleen: Normal in size without focal abnormality. Adrenals/Urinary Tract: Unremarkable adrenal glands. Tiny nonobstructing right nephrolithiasis. No hydronephrosis. Unremarkable bladder. Stomach/Bowel: Stomach is within normal limits. Appendix appears normal. No evidence of bowel wall thickening, distention, or inflammatory changes. Colonic diverticulosis without diverticulitis. Vascular/Lymphatic: No significant vascular findings are present. No enlarged abdominal or pelvic lymph nodes. Reproductive: Prostate is unremarkable. Other: No free intraperitoneal fluid or air. Musculoskeletal: No acute or significant osseous findings. IMPRESSION: Mild edema of the pancreas compatible with pancreatitis. No significant peripancreatic fluid. 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 Electronically Signed: By: Minerva Fester M.D. On: 04/01/2022 00:25    EKG: Independently reviewed.  NSR with rate 105; nonspecific ST changes with no evidence of acute ischemia   Labs on Admission: I have personally reviewed the available labs and imaging studies at the time of the admission.  Pertinent labs:   wbc: 15.5,  lipase: 661,  UA: moderate LE, >50 wbc, rare bacteria  Assessment and Plan: Principal Problem:   Acute pancreatitis Active Problems:   Elevated blood pressure reading   Leukocytosis  Abnormal urine   Cocaine use    Assessment and Plan: * Acute pancreatitis 41 year old male with sudden onset of epigastric pain with vomiting found to have elevated lipase >600 and findings on CT consistent with pancreatitis, suggestive of autoimmune pancreatitis -obs to med-surg -NPO x 2 days and pain nearly resolved and is hungry -start liquid diet -continue IVF -prn  morphine -lipid panel in AM. Check UDS, nearly has stopped smoking. No other risk factors for pancreatitis. No known family history, no px drugs -check IgG 4 for autoimmune pancreatitis, if elevated likely needs steroids and GI consult -discussed with GI and okay to treat like regular pancreatitis for now and f/u on IgG 4 results.   -trend lipase   Elevated blood pressure reading History of HTN in the past, but lost significant amount of weight and states has not had an issue Could be pain vs. HTN Hydralazine PRN and pain control SW consult for PCP   Leukocytosis Likely reactive, no symptoms of infection UA appears dirty and he had no symptoms, but culture is pending CT with possible consolidation in lungs, 2V CXR pending. He has no s/s of pneumonia  Continue IVF, trend cbc and fever curve   Abnormal urine Asymptomatic with rare bacteria, likely dirty Culture pending   Cocaine use States he has not used in [place], UDS states otherwise..     Advance Care Planning:   Code Status: Full Code   Consults: none   DVT Prophylaxis: lovenox   Family Communication: none   Severity of Illness: The appropriate patient status for this patient is OBSERVATION. Observation status is judged to be reasonable and necessary in order to provide the required intensity of service to ensure the patient's safety. The patient's presenting symptoms, physical exam findings, and initial radiographic and laboratory data in the context of their medical condition is felt to place them at decreased risk for further clinical deterioration. Furthermore, it is anticipated that the patient will be medically stable for discharge from the hospital within 2 midnights of admission.   Author: Orma Flaming, MD 04/01/2022 8:13 PM  For on call review [email or web address].

## 2022-04-02 DIAGNOSIS — R030 Elevated blood-pressure reading, without diagnosis of hypertension: Secondary | ICD-10-CM

## 2022-04-02 DIAGNOSIS — F1490 Cocaine use, unspecified, uncomplicated: Secondary | ICD-10-CM | POA: Diagnosis not present

## 2022-04-02 LAB — URINE CULTURE

## 2022-04-02 LAB — LIPID PANEL
Cholesterol: 150 mg/dL (ref 0–200)
HDL: 43 mg/dL (ref >40)
LDL Cholesterol: 96 mg/dL (ref 0–99)
Total CHOL/HDL Ratio: 3.5 RATIO
Triglycerides: 54 mg/dL (ref <150)
VLDL: 11 mg/dL (ref 0–40)

## 2022-04-02 LAB — IGG 4: IgG, Subclass 4: 72 mg/dL (ref 2–96)

## 2022-04-02 LAB — PROCALCITONIN: Procalcitonin: 1.15 ng/mL

## 2022-04-02 LAB — STREP PNEUMONIAE URINARY ANTIGEN: Strep Pneumo Urinary Antigen: NEGATIVE

## 2022-04-02 NOTE — Discharge Summary (Signed)
Discharge Summary  Edward Osborn VEL:[id number] DOB: 05/05/1981  PCP: System, Provider Not In  Admit date: 03/31/2022 Discharge date: 04/02/2022    Time spent:   Recommendations for Outpatient Follow-up:  F/u with PCP within a week  for hospital discharge follow up, repeat cbc/bmp at follow up F/u with GI for pancreatitis   Unresulted Labs (From admission, onward)     Start     Ordered                     04/01/22 1714  IgG 4  Once,   R        04/01/22 1713            Discharge Diagnoses:  Active Hospital Problems   Diagnosis Date Noted   Acute pancreatitis 04/01/2022    Priority: 1.   Elevated blood pressure reading 04/01/2022    Priority: 2.   Leukocytosis 04/01/2022    Priority: 3.   Abnormal urine 04/01/2022    Priority: 4.   Cocaine use 04/01/2022    Resolved Hospital Problems  No resolved problems to display.    Discharge Condition: stable  Diet recommendation: advance diet as tolerated, low fat, avoid alcohol  Filed Weights   03/31/22 2114  Weight: 86.2 kg    History of present illness: ( per admitting MD Dr Artis Flock) Chief Complaint: epigastric pain    HPI: Edward Osborn is a 41 y.o. male with medical history significant of GERD and remote hx of HTN that presented to ED with complaints of epigastric pain. ON Tuesday he had a few episodes of coffee ground emesis. He had no pain at that time. He started to have epigastric pain on late Tuesday night/early Wednesday morning. ON thrusday at work pain became more severe and that night while he was laying down pain was so severe he went to ED. Pain mainly stayed in epigastric area, worse with lying flat. Sometimes radiates to left side. Pain 10/10 and described as stabbing/sharp pain in his stomach.     No known high TG, no family hx of pancreatitis. He does not smoke much at all, no prescription medication.       Denies any fever/chills, vision changes/headaches, chest pain or  palpitations, shortness of breath or cough, dysuria or leg swelling.      He does smoke 1 cig/day and occasionally drinks alcohol. No recent drug use  Hospital Course:  Principal Problem:   Acute pancreatitis Active Problems:   Elevated blood pressure reading   Leukocytosis   Abnormal urine   Cocaine use   Assessment and Plan:  * Acute pancreatitis 41 year old male with sudden onset of epigastric pain with vomiting found to have elevated lipase >600 and findings on CT consistent with pancreatitis, suggestive of autoimmune pancreatitis -check IgG 4 for autoimmune pancreatitis -symptom quickly resolved, denies ab pain, no n/v, lipase normalized, he desires to go home, he agreed to f/u with GI and follow up on IgG4 level -he understand to advance diet as tolerated , low fat diet and avoid alcohol -he is advised to return to the hospital should ab pain reoccur    Elevated blood pressure reading History of HTN in the past, but lost significant amount of weight and states has not had an issue He states he follows [facility], he is advised to f/u with pcp to have outpatient sleep study ( as RN observed snoring and o2 dropped to 85% when he sleeps) UDS+ cocaine could also  cause elevated blood pressure  Leukocytosis Likely reactive, no symptoms of infection UA appears dirty and he had no symptoms CT with possible right lower lungs infiltrate, he reports vomiting a few days ago, possible aspiration pneumonitis He does not have any respiratory symptoms, lung exam benign, no fever He is advised to return to the hospital if develop fever, sob, cough F/u with pcp  Polysubstance use States he has not used in [place], UDS states otherwise. UDS + amphetamines, opiates, cocaine, THC   Discharge Exam: BP (!) 171/96 (BP Location: Right Arm)   Pulse 79   Temp 98.1 F (36.7 C) (Oral)   Resp 16   Ht 5\' 8"  (1.727 m)   Wt 86.2 kg   SpO2 100%   BMI 28.89 kg/m   General: NAD, pleasant   Cardiovascular: RRR Respiratory: CTABL    Discharge Instructions     Diet general   Complete by: As directed    Low fat diet, avoid alcohol  Advance diet slowly from full liquid to bland diet as tolerated,   Increase activity slowly   Complete by: As directed       Allergies as of 04/02/2022       Reactions   Amoxicillin-pot Clavulanate Nausea And Vomiting        Medication List    You have not been prescribed any medications.    Allergies  Allergen Reactions   Amoxicillin-Pot Clavulanate Nausea And Vomiting    Follow-up Information     [facility]   Contact information: [place] [address] [phone]         [facility].   Specialty: Gastroenterology Contact information: [address] [place] [address] [phone]        please follow up with your pcp Follow up in 1 week(s).   Why: hospital discharge follow up, please follow up on pending test result. pcp to refer you to outpatient sleep study to rule out sleep apnea please return to the hospital ED if you develope fever, sob or abdominal pain.                 The results of significant diagnostics from this hospitalization (including imaging, microbiology, ancillary and laboratory) are listed below for reference.    Significant Diagnostic Studies: DG Chest 2 View  Result Date: 04/01/2022 CLINICAL DATA:  Evaluate for lung consolidation. EXAM: CHEST - 2 VIEW COMPARISON:  December 09, 2020 FINDINGS: The heart size and mediastinal contours are within normal limits. Mild patchy infiltrate is seen within the right lower lobe. There is no evidence of a pleural effusion or pneumothorax. The visualized skeletal structures are unremarkable. IMPRESSION: Mild right lower lobe infiltrate. Electronically Signed   By: Virgina Norfolk M.D.   On: 04/01/2022 19:45   CT Abdomen Pelvis W Contrast  Addendum Date:  04/01/2022   ADDENDUM REPORT: 04/01/2022 00:38 ADDENDUM: Addendum to add findings in the lower lungs to impression. Infectious/inflammatory ground-glass opacities in the right lower lobe and lingula. Electronically Signed   By: Placido Sou M.D.   On: 04/01/2022 00:38   Result Date: 04/01/2022 CLINICAL DATA:  Acute severe abdominal pain since 2 days ago with coffee-ground emesis EXAM: CT ABDOMEN AND PELVIS WITH CONTRAST TECHNIQUE: Multidetector CT imaging of the abdomen and pelvis was performed using the standard protocol following bolus administration of intravenous contrast. RADIATION DOSE REDUCTION: This exam was performed according to the departmental dose-optimization program which includes automated  exposure control, adjustment of the mA and/or kV according to patient size and/or use of iterative reconstruction technique. CONTRAST:  8mL OMNIPAQUE IOHEXOL 300 MG/ML  SOLN COMPARISON:  None available FINDINGS: Lower chest: Patchy ground-glass opacities in the right lower lobe and to a lesser extent the lingula compatible with infection. Hepatobiliary: Hypoattenuating areas along the falciform ligament and inferior right hepatic lobe compatible with focal fat deposition. Unremarkable gallbladder. No biliary dilation. Pancreas: Mild edema within the pancreas. No significant peripancreatic fluid or stranding. No ductal dilation Spleen: Normal in size without focal abnormality. Adrenals/Urinary Tract: Unremarkable adrenal glands. Tiny nonobstructing right nephrolithiasis. No hydronephrosis. Unremarkable bladder. Stomach/Bowel: Stomach is within normal limits. Appendix appears normal. No evidence of bowel wall thickening, distention, or inflammatory changes. Colonic diverticulosis without diverticulitis. Vascular/Lymphatic: No significant vascular findings are present. No enlarged abdominal or pelvic lymph nodes. Reproductive: Prostate is unremarkable. Other: No free intraperitoneal fluid or air.  Musculoskeletal: No acute or significant osseous findings. IMPRESSION: Mild edema of the pancreas compatible with pancreatitis. No significant peripancreatic fluid. 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 Electronically Signed: By: Minerva Fester M.D. On: 04/01/2022 00:25    Microbiology: Recent Results (from the past 240 hour(s))  Urine Culture     Status: Abnormal   Collection Time: 04/01/22  1:07 AM   Specimen: Urine, Clean Catch  Result Value Ref Range Status   Specimen Description   Final    URINE, CLEAN CATCH Performed at [facility], [address], [place], [place] [address]    Special Requests   Final    NONE Performed at [facility], [address], [place], [place] [address]    Culture MULTIPLE SPECIES PRESENT, SUGGEST RECOLLECTION (A)  Final   Report Status 04/02/2022 FINAL  Final     Labs: Basic Metabolic Panel: Recent Labs  Lab 03/31/22 2119  NA 135  K 4.1  CL 99  CO2 28  GLUCOSE 154*  BUN 8  CREATININE 0.96  CALCIUM 9.9   Liver Function Tests: Recent Labs  Lab 03/31/22 2119  AST 34  ALT 25  ALKPHOS 65  BILITOT 1.0  PROT 7.7  ALBUMIN 4.1   Recent Labs  Lab 03/31/22 2119 04/01/22 1821  LIPASE 661* 38   No results for input(s): "AMMONIA" in the last 168 hours. CBC: Recent Labs  Lab 03/31/22 2119 04/01/22 1821  WBC 15.5* 14.1*  HGB 14.8 14.5  HCT 45.9 43.6  MCV 84.8 84.3  PLT 296 314   Cardiac Enzymes: No results for input(s): "CKTOTAL", "CKMB", "CKMBINDEX", "TROPONINI" in the last 168 hours. BNP: BNP (last 3 results) No results for input(s): "BNP" in the last 8760 hours.  ProBNP (last 3 results) No results for input(s): "PROBNP" in the last 8760 hours.  CBG: No results for input(s): "GLUCAP" in the last 168 hours.  FURTHER DISCHARGE INSTRUCTIONS:   Get Medicines reviewed and adjusted: Please take all your medications with you for your next visit with your  Primary MD   Laboratory/radiological data: Please request your Primary MD to go over all hospital tests and procedure/radiological results at the follow up, please ask your Primary MD to get all Hospital records sent to his/her office.   In some cases, they will be blood work, cultures and biopsy results pending at the time of your discharge. Please request that your primary care M.D. goes through all the records of your hospital data and follows up on these results.   Also Note the following: If you  experience worsening of your admission symptoms, develop shortness of breath, life threatening emergency, suicidal or homicidal thoughts you must seek medical attention immediately by calling 911 or calling your MD immediately  if symptoms less severe.   You must read complete instructions/literature along with all the possible adverse reactions/side effects for all the Medicines you take and that have been prescribed to you. Take any new Medicines after you have completely understood and accpet all the possible adverse reactions/side effects.    Do not drive when taking Pain medications or sleeping medications (Benzodaizepines)   Do not take more than prescribed Pain, Sleep and Anxiety Medications. It is not advisable to combine anxiety,sleep and pain medications without talking with your primary care practitioner   Special Instructions: If you have smoked or chewed Tobacco  in the last 2 yrs please stop smoking, stop any regular Alcohol  and or any Recreational drug use.   Wear Seat belts while driving.   Please note: You were cared for by a hospitalist during your hospital stay. Once you are discharged, your primary care physician will handle any further medical issues. Please note that NO REFILLS for any discharge medications will be authorized once you are discharged, as it is imperative that you return to your primary care physician (or establish a relationship with a primary care physician if  you do not have one) for your post hospital discharge needs so that they can reassess your need for medications and monitor your lab values.     Signed:  Albertine Grates MD, PhD, FACP  [facility] 04/02/2022, 11:33 AM

## 2022-04-05 LAB — LEGIONELLA PNEUMOPHILA SEROGP 1 UR AG: L. pneumophila Serogp 1 Ur Ag: NEGATIVE

## 2023-03-17 ENCOUNTER — Other Ambulatory Visit: Payer: Self-pay

## 2023-03-17 ENCOUNTER — Observation Stay (HOSPITAL_COMMUNITY)
Admission: EM | Admit: 2023-03-17 | Discharge: 2023-03-20 | Disposition: A | Discharge status: Home / Self Care | Payer: Commercial Managed Care - HMO | Attending: General Surgery | Admitting: General Surgery

## 2023-03-17 ENCOUNTER — Encounter (HOSPITAL_COMMUNITY): Payer: Self-pay | Admitting: *Deleted

## 2023-03-17 DIAGNOSIS — I10 Essential (primary) hypertension: Secondary | ICD-10-CM | POA: Diagnosis not present

## 2023-03-17 DIAGNOSIS — S060X0A Concussion without loss of consciousness, initial encounter: Secondary | ICD-10-CM | POA: Diagnosis not present

## 2023-03-17 DIAGNOSIS — R519 Headache, unspecified: Secondary | ICD-10-CM | POA: Diagnosis present

## 2023-03-17 DIAGNOSIS — R079 Chest pain, unspecified: Secondary | ICD-10-CM | POA: Diagnosis not present

## 2023-03-17 DIAGNOSIS — R946 Abnormal results of thyroid function studies: Secondary | ICD-10-CM | POA: Diagnosis not present

## 2023-03-17 DIAGNOSIS — S0181XA Laceration without foreign body of other part of head, initial encounter: Secondary | ICD-10-CM | POA: Diagnosis not present

## 2023-03-17 DIAGNOSIS — S32048A Other fracture of fourth lumbar vertebra, initial encounter for closed fracture: Secondary | ICD-10-CM | POA: Diagnosis not present

## 2023-03-17 DIAGNOSIS — R109 Unspecified abdominal pain: Secondary | ICD-10-CM | POA: Insufficient documentation

## 2023-03-17 DIAGNOSIS — Z23 Encounter for immunization: Secondary | ICD-10-CM | POA: Diagnosis not present

## 2023-03-17 DIAGNOSIS — S32028A Other fracture of second lumbar vertebra, initial encounter for closed fracture: Secondary | ICD-10-CM | POA: Diagnosis not present

## 2023-03-17 DIAGNOSIS — S060XAA Concussion with loss of consciousness status unknown, initial encounter: Secondary | ICD-10-CM

## 2023-03-17 DIAGNOSIS — S32009A Unspecified fracture of unspecified lumbar vertebra, initial encounter for closed fracture: Secondary | ICD-10-CM

## 2023-03-17 DIAGNOSIS — Z79899 Other long term (current) drug therapy: Secondary | ICD-10-CM | POA: Insufficient documentation

## 2023-03-17 DIAGNOSIS — F17210 Nicotine dependence, cigarettes, uncomplicated: Secondary | ICD-10-CM | POA: Insufficient documentation

## 2023-03-17 DIAGNOSIS — S0083XA Contusion of other part of head, initial encounter: Secondary | ICD-10-CM
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
## 2023-03-18 ENCOUNTER — Emergency Department (HOSPITAL_COMMUNITY): Payer: Commercial Managed Care - HMO

## 2023-03-18 ENCOUNTER — Observation Stay (HOSPITAL_COMMUNITY): Payer: Commercial Managed Care - HMO

## 2023-03-18 DIAGNOSIS — S0181XA Laceration without foreign body of other part of head, initial encounter: Secondary | ICD-10-CM | POA: Diagnosis not present

## 2023-03-18 LAB — RAPID URINE DRUG SCREEN, HOSP PERFORMED
Amphetamines: POSITIVE — AB
Barbiturates: NOT DETECTED
Benzodiazepines: NOT DETECTED
Cocaine: POSITIVE — AB
Opiates: NOT DETECTED
Tetrahydrocannabinol: NOT DETECTED

## 2023-03-18 LAB — PROTIME-INR
INR: 1 (ref 0.8–1.2)
Prothrombin Time: 13.1 s (ref 11.4–15.2)

## 2023-03-18 LAB — I-STAT CHEM 8, ED
BUN: 13 mg/dL (ref 6–20)
Calcium, Ion: 1.15 mmol/L (ref 1.15–1.40)
Chloride: 104 mmol/L (ref 98–111)
Creatinine, Ser: 1.1 mg/dL (ref 0.61–1.24)
Glucose, Bld: 112 mg/dL — ABNORMAL HIGH (ref 70–99)
HCT: 44 % (ref 39.0–52.0)
Hemoglobin: 15 g/dL (ref 13.0–17.0)
Potassium: 4.1 mmol/L (ref 3.5–5.1)
Sodium: 138 mmol/L (ref 135–145)
TCO2: 23 mmol/L (ref 22–32)

## 2023-03-18 LAB — URINALYSIS, ROUTINE W REFLEX MICROSCOPIC
Bilirubin Urine: NEGATIVE
Glucose, UA: NEGATIVE mg/dL
Ketones, ur: NEGATIVE mg/dL
Leukocytes,Ua: NEGATIVE
Nitrite: NEGATIVE
Protein, ur: 100 mg/dL — AB
Specific Gravity, Urine: 1.01 (ref 1.005–1.030)
pH: 6 (ref 5.0–8.0)

## 2023-03-18 LAB — TROPONIN I (HIGH SENSITIVITY)
Troponin I (High Sensitivity): 8 ng/L (ref <18)
Troponin I (High Sensitivity): 11 ng/L (ref <18)

## 2023-03-18 LAB — COMPREHENSIVE METABOLIC PANEL
ALT: 19 U/L (ref 0–44)
AST: 33 U/L (ref 15–41)
Albumin: 3.7 g/dL (ref 3.5–5.0)
Alkaline Phosphatase: 70 U/L (ref 38–126)
Anion gap: 13 (ref 5–15)
BUN: 13 mg/dL (ref 6–20)
CO2: 20 mmol/L — ABNORMAL LOW (ref 22–32)
Calcium: 9 mg/dL (ref 8.9–10.3)
Chloride: 102 mmol/L (ref 98–111)
Creatinine, Ser: 1.06 mg/dL (ref 0.61–1.24)
GFR, Estimated: >60 mL/min (ref >60)
Glucose, Bld: 112 mg/dL — ABNORMAL HIGH (ref 70–99)
Potassium: 4.1 mmol/L (ref 3.5–5.1)
Sodium: 135 mmol/L (ref 135–145)
Total Bilirubin: 0.6 mg/dL (ref 0.3–1.2)
Total Protein: 7 g/dL (ref 6.5–8.1)

## 2023-03-18 LAB — URINALYSIS, MICROSCOPIC (REFLEX)

## 2023-03-18 LAB — I-STAT VENOUS BLOOD GAS, ED
Acid-Base Excess: 0 mmol/L (ref 0.0–2.0)
Bicarbonate: 24.1 mmol/L (ref 20.0–28.0)
Calcium, Ion: 1.12 mmol/L — ABNORMAL LOW (ref 1.15–1.40)
HCT: 41 % (ref 39.0–52.0)
Hemoglobin: 13.9 g/dL (ref 13.0–17.0)
O2 Saturation: 95 %
Potassium: 4.3 mmol/L (ref 3.5–5.1)
Sodium: 139 mmol/L (ref 135–145)
TCO2: 25 mmol/L (ref 22–32)
pCO2, Ven: 37.1 mmHg — ABNORMAL LOW (ref 44–60)
pH, Ven: 7.422 (ref 7.25–7.43)
pO2, Ven: 72 mmHg — ABNORMAL HIGH (ref 32–45)

## 2023-03-18 LAB — T4, FREE: Free T4: 0.82 ng/dL (ref 0.61–1.12)

## 2023-03-18 LAB — CBC
HCT: 43.1 % (ref 39.0–52.0)
Hemoglobin: 13.9 g/dL (ref 13.0–17.0)
MCH: 27.7 pg (ref 26.0–34.0)
MCHC: 32.3 g/dL (ref 30.0–36.0)
MCV: 85.9 fL (ref 80.0–100.0)
Platelets: 310 10*3/uL (ref 150–400)
RBC: 5.02 MIL/uL (ref 4.22–5.81)
RDW: 14.4 % (ref 11.5–15.5)
WBC: 20.9 10*3/uL — ABNORMAL HIGH (ref 4.0–10.5)
nRBC: 0 % (ref 0.0–0.2)

## 2023-03-18 LAB — SAMPLE TO BLOOD BANK

## 2023-03-18 LAB — ETHANOL: Alcohol, Ethyl (B): <10 mg/dL (ref <10)

## 2023-03-18 LAB — TSH: TSH: 0.251 u[IU]/mL — ABNORMAL LOW (ref 0.350–4.500)

## 2023-03-18 LAB — AMMONIA: Ammonia: 35 umol/L (ref 9–35)

## 2023-03-18 LAB — I-STAT CG4 LACTIC ACID, ED: Lactic Acid, Venous: 2.3 mmol/L (ref 0.5–1.9)

## 2023-03-18 MED ORDER — OXYCODONE HCL 5 MG PO TABS
5.0000 mg | ORAL_TABLET | ORAL | Status: DC | PRN
  Administered 2023-03-18 – 2023-03-19 (×5): 10 mg via ORAL
  Filled 2023-03-18 (×5): qty 2

## 2023-03-18 MED ORDER — ACETAMINOPHEN 500 MG PO TABS
1000.0000 mg | ORAL_TABLET | Freq: Four times a day (QID) | ORAL | Status: DC
  Administered 2023-03-18 – 2023-03-20 (×7): 1000 mg via ORAL
  Filled 2023-03-18 (×7): qty 2

## 2023-03-18 MED ORDER — POLYETHYLENE GLYCOL 3350 17 G PO PACK: 17.0000 g | PACK | Freq: Every day | ORAL | Status: DC | PRN

## 2023-03-18 MED ORDER — PANTOPRAZOLE SODIUM 40 MG PO TBEC
40.0000 mg | DELAYED_RELEASE_TABLET | Freq: Every day | ORAL | Status: DC
  Administered 2023-03-18 – 2023-03-20 (×3): 40 mg via ORAL
  Filled 2023-03-18 (×3): qty 1

## 2023-03-18 MED ORDER — CEFAZOLIN SODIUM-DEXTROSE 2-4 GM/100ML-% IV SOLN
2.0000 g | Freq: Once | INTRAVENOUS | Status: AC
  Administered 2023-03-18: 2 g via INTRAVENOUS
  Filled 2023-03-18: qty 100

## 2023-03-18 MED ORDER — SODIUM CHLORIDE 0.9 % IV SOLN: INTRAVENOUS | Status: DC

## 2023-03-18 MED ORDER — METOPROLOL TARTRATE 5 MG/5ML IV SOLN
5.0000 mg | Freq: Four times a day (QID) | INTRAVENOUS | Status: DC | PRN
  Administered 2023-03-18: 5 mg via INTRAVENOUS
  Filled 2023-03-18 (×2): qty 5

## 2023-03-18 MED ORDER — METHOCARBAMOL 500 MG PO TABS
500.0000 mg | ORAL_TABLET | Freq: Four times a day (QID) | ORAL | Status: DC | PRN
  Administered 2023-03-19: 500 mg via ORAL
  Filled 2023-03-18: qty 1

## 2023-03-18 MED ORDER — ONDANSETRON 4 MG PO TBDP: 4.0000 mg | ORAL_TABLET | Freq: Four times a day (QID) | ORAL | Status: DC | PRN

## 2023-03-18 MED ORDER — SODIUM CHLORIDE 0.9 % IV BOLUS
1000.0000 mL | Freq: Once | INTRAVENOUS | Status: AC
  Administered 2023-03-18: 1000 mL via INTRAVENOUS

## 2023-03-18 MED ORDER — PANTOPRAZOLE SODIUM 40 MG IV SOLR
40.0000 mg | Freq: Every day | INTRAVENOUS | Status: DC
  Filled 2023-03-18: qty 10

## 2023-03-18 MED ORDER — HYDRALAZINE HCL 20 MG/ML IJ SOLN
10.0000 mg | INTRAMUSCULAR | Status: DC | PRN
  Administered 2023-03-19: 10 mg via INTRAVENOUS
  Filled 2023-03-18: qty 1

## 2023-03-18 MED ORDER — IOHEXOL 350 MG/ML SOLN
75.0000 mL | Freq: Once | INTRAVENOUS | Status: AC | PRN
  Administered 2023-03-18: 75 mL via INTRAVENOUS

## 2023-03-18 MED ORDER — HYDROMORPHONE HCL 1 MG/ML IJ SOLN: 0.5000 mg | INTRAMUSCULAR | Status: DC | PRN

## 2023-03-18 MED ORDER — PROCHLORPERAZINE EDISYLATE 10 MG/2ML IJ SOLN: 10.0000 mg | Freq: Four times a day (QID) | INTRAMUSCULAR | Status: DC | PRN

## 2023-03-18 MED ORDER — TETANUS-DIPHTH-ACELL PERTUSSIS 5-2.5-18.5 LF-MCG/0.5 IM SUSY
0.5000 mL | PREFILLED_SYRINGE | Freq: Once | INTRAMUSCULAR | Status: AC
  Administered 2023-03-18: 0.5 mL via INTRAMUSCULAR
  Filled 2023-03-18: qty 0.5

## 2023-03-18 MED ORDER — ONDANSETRON HCL 4 MG/2ML IJ SOLN: 4.0000 mg | Freq: Four times a day (QID) | INTRAMUSCULAR | Status: DC | PRN

## 2023-03-18 MED ORDER — LIDOCAINE-EPINEPHRINE-TETRACAINE (LET) TOPICAL GEL
3.0000 mL | Freq: Once | TOPICAL | Status: AC
  Administered 2023-03-18: 3 mL via TOPICAL
  Filled 2023-03-18: qty 3

## 2023-03-18 MED ORDER — GADOBUTROL 1 MMOL/ML IV SOLN
8.0000 mL | Freq: Once | INTRAVENOUS | Status: AC | PRN
  Administered 2023-03-18: 8 mL via INTRAVENOUS

## 2023-03-18 MED ORDER — LIDOCAINE 5 % EX PTCH
1.0000 | MEDICATED_PATCH | CUTANEOUS | Status: DC
  Administered 2023-03-20: 1 via TRANSDERMAL
  Filled 2023-03-18 (×2): qty 1

## 2023-03-18 MED ORDER — METOPROLOL TARTRATE 5 MG/5ML IV SOLN: 5.0000 mg | Freq: Four times a day (QID) | INTRAVENOUS | Status: DC | PRN

## 2023-03-18 MED ORDER — METHOCARBAMOL 1000 MG/10ML IJ SOLN
500.0000 mg | Freq: Four times a day (QID) | INTRAVENOUS | Status: DC | PRN
  Filled 2023-03-18: qty 5

## 2023-03-18 MED ORDER — ENOXAPARIN SODIUM 30 MG/0.3ML IJ SOSY
30.0000 mg | PREFILLED_SYRINGE | Freq: Two times a day (BID) | INTRAMUSCULAR | Status: DC
  Administered 2023-03-19: 30 mg via SUBCUTANEOUS
  Filled 2023-03-18 (×3): qty 0.3

## 2023-03-18 MED ORDER — DOCUSATE SODIUM 100 MG PO CAPS
100.0000 mg | ORAL_CAPSULE | Freq: Two times a day (BID) | ORAL | Status: DC
  Administered 2023-03-18 – 2023-03-20 (×3): 100 mg via ORAL
  Filled 2023-03-18 (×5): qty 1

## 2023-03-18 NOTE — ED Notes (Signed)
Fluids noted to be hanging to gravity and not running, so placed onto pump and restarted.
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
## 2023-03-18 NOTE — Progress Notes (Signed)
   03/18/23 0000  Spiritual Encounters  Type of Visit Initial  Care provided to: Patient  Referral source Trauma page  Reason for visit Trauma  OnCall Visit Yes   Ch responded to trauma II page. There was no family at bedside. Ch provided hospitality and compassionate presence. No follow-up needed.

## 2023-03-18 NOTE — TOC CAGE-AID Note (Signed)
Transition of Care [facility]) - CAGE-AID Screening  Patient Details  Name: Edward Osborn MRN: [id number] Date of Birth: 03-08-1981  Clinical Narrative:  Patient endorses occasional alcohol use, cocaine use as recently as yesterday, THC use. Patient was also UDS positive for amphetamines during ED work-up. TOC consult already placed by trauma team for substance abuse education and resources.  CAGE-AID Screening:   Have You Ever Felt You Ought to Cut Down on Your Drinking or Drug Use?: Yes Have People Annoyed You By Critizing Your Drinking Or Drug Use?: No Have You Felt Bad Or Guilty About Your Drinking Or Drug Use?: Yes Have You Ever Had a Drink or Used Drugs First Thing In The Morning to Steady Your Nerves or to Get Rid of a Hangover?: No CAGE-AID Score: 2  Substance Abuse Education Offered: Yes (TOC consult)
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
## 2023-03-18 NOTE — ED Notes (Signed)
Wound cleaned with warm rag. Wounds revealed showing an approximate 1 inch lac around right eye. Small 1/4 inch lac above left eye. LET applied

## 2023-03-18 NOTE — ED Notes (Signed)
Ortho tech called for back brace.

## 2023-03-18 NOTE — Plan of Care (Signed)

## 2023-03-18 NOTE — Progress Notes (Signed)
Orthopedic Tech Progress Note Patient Details:  Edward Osborn 1980-09-09 [id number]  Came to fit patient with back brace I was told he was leaving. I let PA know brace was ordered for OOB/AMBULATION. Brace is in room. I let day shift know brace is here. Patient has to be dressed   Patient ID: Edward Osborn, male   DOB: 1980-11-29, 42 y.o.   MRN: [id number]  Edward Osborn 03/18/2023, 7:18 AM
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
## 2023-03-18 NOTE — ED Notes (Signed)
Patient to CT with this RN.

## 2023-03-18 NOTE — Progress Notes (Signed)
OT Cancellation Note  Patient Details Name: Edward Osborn MRN: [id number] DOB: Feb 03, 1981   Cancelled Treatment:    Reason Eval/Treat Not Completed:  (Transport came to take pt to MRI)  Presley Raddle OTR/L  Acute Rehab Services  [phone] office number  Alphia Moh 03/18/2023, 2:47 PM

## 2023-03-19 DIAGNOSIS — S0181XA Laceration without foreign body of other part of head, initial encounter: Secondary | ICD-10-CM | POA: Diagnosis not present

## 2023-03-19 MED ORDER — DOCUSATE SODIUM 100 MG PO CAPS: 100.0000 mg | ORAL_CAPSULE | Freq: Two times a day (BID) | ORAL | Status: AC

## 2023-03-19 MED ORDER — ACETAMINOPHEN 500 MG PO TABS: 1000.0000 mg | ORAL_TABLET | Freq: Four times a day (QID) | ORAL | Status: AC | PRN

## 2023-03-19 MED ORDER — OXYCODONE HCL 5 MG PO TABS: 5.0000 mg | ORAL_TABLET | Freq: Four times a day (QID) | ORAL | 0 refills | Status: DC | PRN

## 2023-03-19 MED ORDER — POLYETHYLENE GLYCOL 3350 17 G PO PACK: 17.0000 g | PACK | Freq: Every day | ORAL | Status: AC | PRN

## 2023-03-19 MED ORDER — METHOCARBAMOL 500 MG PO TABS: 500.0000 mg | ORAL_TABLET | Freq: Four times a day (QID) | ORAL | 0 refills | Status: DC | PRN

## 2023-03-19 MED ORDER — AMLODIPINE BESYLATE 5 MG PO TABS: 5.0000 mg | ORAL_TABLET | Freq: Every day | ORAL | 0 refills | Status: DC

## 2023-03-19 MED ORDER — AMLODIPINE BESYLATE 5 MG PO TABS
5.0000 mg | ORAL_TABLET | Freq: Every day | ORAL | Status: DC
  Administered 2023-03-19 – 2023-03-20 (×2): 5 mg via ORAL
  Filled 2023-03-19 (×2): qty 1
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
## 2023-03-19 NOTE — Plan of Care (Signed)

## 2023-03-19 NOTE — Progress Notes (Signed)
MEWS Progress Note  Patient Details Name: Bernhardt Alzamora MRN: [id number] DOB: [birth year] Today's Date: 03/19/2023   MEWS Flowsheet Documentation:  Assess: MEWS Score Temp: 98.3 F (36.8 C) BP: (!) 162/85 MAP (mmHg): 101 Pulse Rate: (!) 103 ECG Heart Rate: 83 Resp: 13 Level of Consciousness: Alert SpO2: 99 % O2 Device: Room Air Assess: MEWS Score MEWS Temp: 0 MEWS Systolic: 0 MEWS Pulse: 1 MEWS RR: 1 MEWS LOC: 0 MEWS Score: 2 MEWS Score Color: Yellow Assess: SIRS CRITERIA SIRS Temperature : 0 SIRS Respirations : 0 SIRS Pulse: 1 SIRS WBC: 0 SIRS Score Sum : 1 SIRS Temperature : 0 SIRS Pulse: 1 SIRS Respirations : 0 SIRS WBC: 0 SIRS Score Sum : 1 Assess: if the MEWS score is Yellow or Red Were vital signs accurate and taken at a resting state?: Yes Does the patient meet 2 or more of the SIRS criteria?: Yes Does the patient have a confirmed or suspected source of infection?: No MEWS guidelines implemented : Yes, yellow Treat MEWS Interventions: Considered administering scheduled or prn medications/treatments as ordered Take Vital Signs Increase Vital Sign Frequency : Yellow: Q2hr x1, continue Q4hrs until patient remains green for 12hrs Escalate MEWS: Escalate: Yellow: Discuss with charge nurse and consider notifying provider and/or RRT Notify: Charge Nurse/RN Name of Charge Nurse/RN Notified: Ben, RN Provider Notification Provider Name/Title: Carlena Bjornstad PA-C Date Provider Notified: 03/19/23 Time Provider Notified: 1015 Method of Notification: Page (secure chat) Notification Reason: Other (Comment) (MEWS protocol) Provider response: No new orders Date of Provider Response: 03/19/23 Time of Provider Response: 1120      Tanashia Ciesla E Adrinne Sze 03/19/2023, 11:25 AM
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
## 2023-03-20 ENCOUNTER — Other Ambulatory Visit (HOSPITAL_COMMUNITY): Payer: Self-pay

## 2023-03-20 DIAGNOSIS — S0181XA Laceration without foreign body of other part of head, initial encounter: Secondary | ICD-10-CM | POA: Diagnosis not present

## 2023-03-20 MED ORDER — AMLODIPINE BESYLATE 5 MG PO TABS
5.0000 mg | ORAL_TABLET | Freq: Every day | ORAL | 0 refills | Status: AC
  Filled 2023-03-20: qty 30, 30d supply, fill #0

## 2023-03-20 MED ORDER — METHOCARBAMOL 500 MG PO TABS
500.0000 mg | ORAL_TABLET | Freq: Four times a day (QID) | ORAL | 0 refills | Status: AC | PRN
  Filled 2023-03-20: qty 30, 8d supply, fill #0

## 2023-03-20 MED ORDER — OXYCODONE HCL 5 MG PO TABS
5.0000 mg | ORAL_TABLET | Freq: Four times a day (QID) | ORAL | 0 refills | Status: AC | PRN
  Filled 2023-03-20: qty 20, 5d supply, fill #0
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
## 2023-03-20 NOTE — Progress Notes (Signed)
Pt refused Lovenox injection. States he's been getting oob and moving around and does not need this anymore. He does have SCDs at bedside, but wears intermittently. Encouraged pt to still wear SCDs and receive Lovenox for DVT prevention and pt still refused Lovenox @2200  9/15. Will continue to encourage SCDs and educate pt. Notified dayshift nurse.
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
## 2023-05-13 ENCOUNTER — Other Ambulatory Visit: Payer: Self-pay

## 2023-05-13 ENCOUNTER — Emergency Department (HOSPITAL_COMMUNITY): Payer: Commercial Managed Care - HMO

## 2023-05-13 ENCOUNTER — Emergency Department (HOSPITAL_COMMUNITY)
Admission: EM | Admit: 2023-05-13 | Discharge: 2023-05-14 | Disposition: A | Discharge status: Home / Self Care | Payer: Commercial Managed Care - HMO | Attending: Emergency Medicine | Admitting: Emergency Medicine

## 2023-05-13 ENCOUNTER — Encounter (HOSPITAL_COMMUNITY): Payer: Self-pay | Admitting: *Deleted

## 2023-05-13 DIAGNOSIS — I10 Essential (primary) hypertension: Secondary | ICD-10-CM | POA: Diagnosis not present

## 2023-05-13 DIAGNOSIS — Z79899 Other long term (current) drug therapy: Secondary | ICD-10-CM | POA: Diagnosis not present

## 2023-05-13 DIAGNOSIS — T402X1A Poisoning by other opioids, accidental (unintentional), initial encounter: Secondary | ICD-10-CM | POA: Insufficient documentation

## 2023-05-13 DIAGNOSIS — T40601A Poisoning by unspecified narcotics, accidental (unintentional), initial encounter: Secondary | ICD-10-CM

## 2023-05-13 LAB — COMPREHENSIVE METABOLIC PANEL
ALT: 13 U/L (ref 0–44)
AST: 18 U/L (ref 15–41)
Albumin: 3.7 g/dL (ref 3.5–5.0)
Alkaline Phosphatase: 97 U/L (ref 38–126)
Anion gap: 7 (ref 5–15)
BUN: 11 mg/dL (ref 6–20)
CO2: 28 mmol/L (ref 22–32)
Calcium: 9.1 mg/dL (ref 8.9–10.3)
Chloride: 104 mmol/L (ref 98–111)
Creatinine, Ser: 1.23 mg/dL (ref 0.61–1.24)
GFR, Estimated: >60 mL/min (ref >60)
Glucose, Bld: 108 mg/dL — ABNORMAL HIGH (ref 70–99)
Potassium: 4.6 mmol/L (ref 3.5–5.1)
Sodium: 139 mmol/L (ref 135–145)
Total Bilirubin: 1.1 mg/dL (ref <1.2)
Total Protein: 7.2 g/dL (ref 6.5–8.1)

## 2023-05-13 LAB — CBC WITH DIFFERENTIAL/PLATELET
Abs Immature Granulocytes: 0.1 10*3/uL — ABNORMAL HIGH (ref 0.00–0.07)
Basophils Absolute: 0.1 10*3/uL (ref 0.0–0.1)
Basophils Relative: 1 %
Eosinophils Absolute: 0.2 10*3/uL (ref 0.0–0.5)
Eosinophils Relative: 2 %
HCT: 44.3 % (ref 39.0–52.0)
Hemoglobin: 13.8 g/dL (ref 13.0–17.0)
Immature Granulocytes: 1 %
Lymphocytes Relative: 27 %
Lymphs Abs: 2.5 10*3/uL (ref 0.7–4.0)
MCH: 27.2 pg (ref 26.0–34.0)
MCHC: 31.2 g/dL (ref 30.0–36.0)
MCV: 87.4 fL (ref 80.0–100.0)
Monocytes Absolute: 0.8 10*3/uL (ref 0.1–1.0)
Monocytes Relative: 8 %
Neutro Abs: 5.8 10*3/uL (ref 1.7–7.7)
Neutrophils Relative %: 61 %
Platelets: 268 10*3/uL (ref 150–400)
RBC: 5.07 MIL/uL (ref 4.22–5.81)
RDW: 13.5 % (ref 11.5–15.5)
WBC: 9.4 10*3/uL (ref 4.0–10.5)
nRBC: 0 % (ref 0.0–0.2)

## 2023-05-13 LAB — I-STAT VENOUS BLOOD GAS, ED
Acid-Base Excess: 4 mmol/L — ABNORMAL HIGH (ref 0.0–2.0)
Bicarbonate: 32.6 mmol/L — ABNORMAL HIGH (ref 20.0–28.0)
Calcium, Ion: 1.15 mmol/L (ref 1.15–1.40)
HCT: 44 % (ref 39.0–52.0)
Hemoglobin: 15 g/dL (ref 13.0–17.0)
O2 Saturation: 69 %
Potassium: 4.6 mmol/L (ref 3.5–5.1)
Sodium: 141 mmol/L (ref 135–145)
TCO2: 35 mmol/L — ABNORMAL HIGH (ref 22–32)
pCO2, Ven: 64.6 mm[Hg] — ABNORMAL HIGH (ref 44–60)
pH, Ven: 7.311 (ref 7.25–7.43)
pO2, Ven: 40 mm[Hg] (ref 32–45)

## 2023-05-13 LAB — ETHANOL: Alcohol, Ethyl (B): <10 mg/dL (ref <10)

## 2023-05-13 MED ORDER — NALOXONE HCL 0.4 MG/ML IJ SOLN
0.4000 mg | Freq: Once | INTRAMUSCULAR | Status: AC
  Administered 2023-05-13: 0.4 mg via INTRAVENOUS

## 2023-05-13 MED ORDER — NALOXONE HCL 0.4 MG/ML IJ SOLN
INTRAMUSCULAR | Status: AC
  Filled 2023-05-13: qty 1

## 2023-05-13 MED ORDER — NALOXONE HCL 4 MG/10ML IJ SOLN
0.2500 mg/h | INTRAVENOUS | Status: DC
  Filled 2023-05-13: qty 10

## 2023-05-13 NOTE — ED Notes (Signed)
Pt resting with eyes closed, responds quickly when called. Denies any complaints at this time
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
## 2023-05-13 NOTE — ED Notes (Signed)
Pt drowy; alert and responds to voice; urinal at bedside

## 2023-05-14 DIAGNOSIS — T402X1A Poisoning by other opioids, accidental (unintentional), initial encounter: Secondary | ICD-10-CM | POA: Diagnosis not present

## 2023-05-14 MED ORDER — NALOXONE HCL 4 MG/0.1ML NA LIQD
NASAL | 0 refills | Status: AC
Start: 2023-05-14 — End: ?

## 2023-05-14 MED ORDER — NALOXONE HCL 0.4 MG/ML IJ SOLN
INTRAMUSCULAR | Status: AC
  Filled 2023-05-14: qty 1

## 2023-05-14 MED ORDER — NALOXONE HCL 0.4 MG/ML IJ SOLN
0.4000 mg | INTRAMUSCULAR | Status: DC | PRN
  Administered 2023-05-14: 0.4 mg via INTRAVENOUS

## 2023-05-14 NOTE — ED Notes (Signed)
Pt with one episode of apnea and frequent desats; oxygen level dropping to 83% on room air; pt responsive when called

## 2023-05-14 NOTE — ED Notes (Signed)
Pt tolerating water to drink

## 2023-05-14 NOTE — Discharge Instructions (Signed)
Stop using illicit drugs.  Follow-up with your primary doctor.  Use the Narcan as needed for accidental overdose.  Return to the ED with new or worsening symptoms.

## 2023-05-14 NOTE — ED Provider Notes (Signed)
4:15 AM.  Patient is awake and alert.  Tolerating p.o. and ambulatory in the room.  He states he feels fine and wants to go home.  No chest pain or shortness of breath.  Denies intentional thoughts of hurting himself.  Discussed cessation of illicit substances.  Narcan given for home use.  Return precautions discussed.   Glynn Octave, MD 05/14/23 [phone]

## 2023-05-14 NOTE — ED Notes (Signed)
Pt had called ride for discharge and requested to leave prior to receiving paperwork. Educated pt on drug use and overdose; given return precautions

## 2023-05-14 NOTE — ED Notes (Signed)
Pt ambulated around the room with steady gait; MD made aware

## 2023-06-02 IMAGING — CR DG LUMBAR SPINE COMPLETE 4+V
5 series · 5 of 5 positions shown · non-contrast
Comparison: None.

CLINICAL DATA: Low back pain.  Uncertain trauma.

EXAM:
LUMBAR SPINE - COMPLETE 4+ VIEW

[t lumbar spine lat]
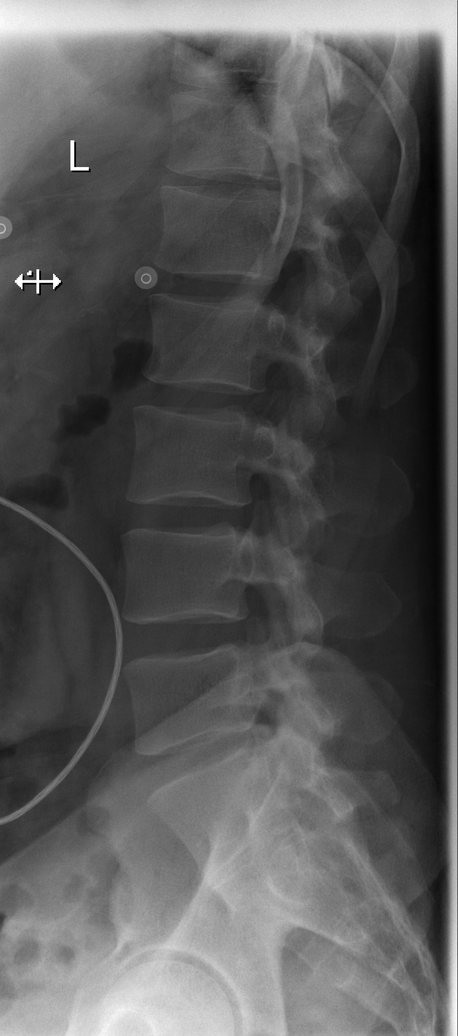

[t lumbar l-5 s-1 spot]
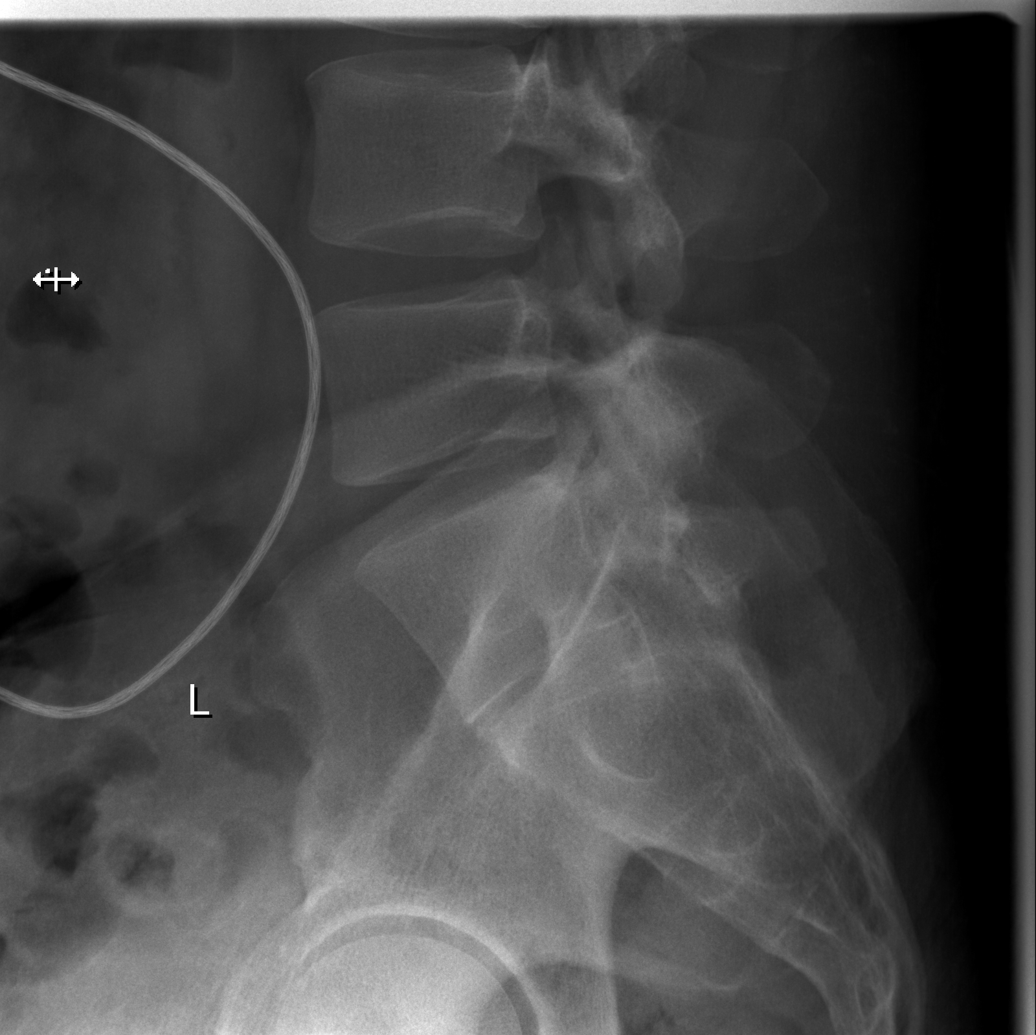

[t lumbar spine ap]
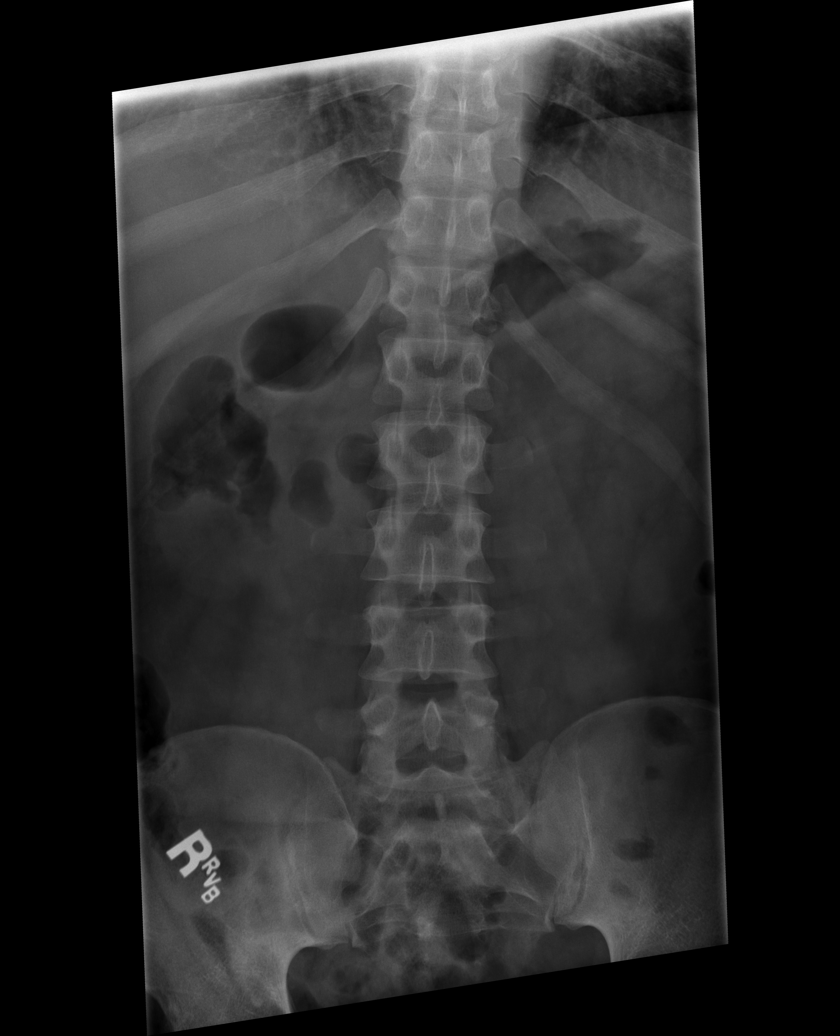

[t lumbar spine obl (1 of 2)]
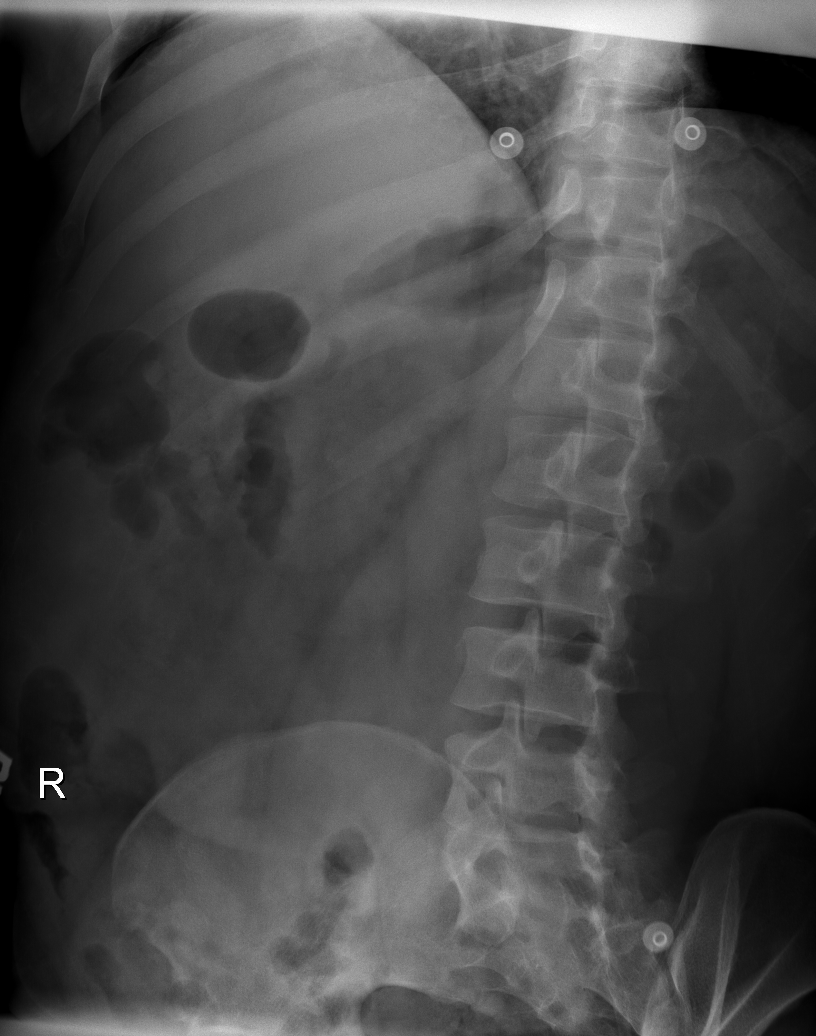

[t lumbar spine obl (2 of 2)]
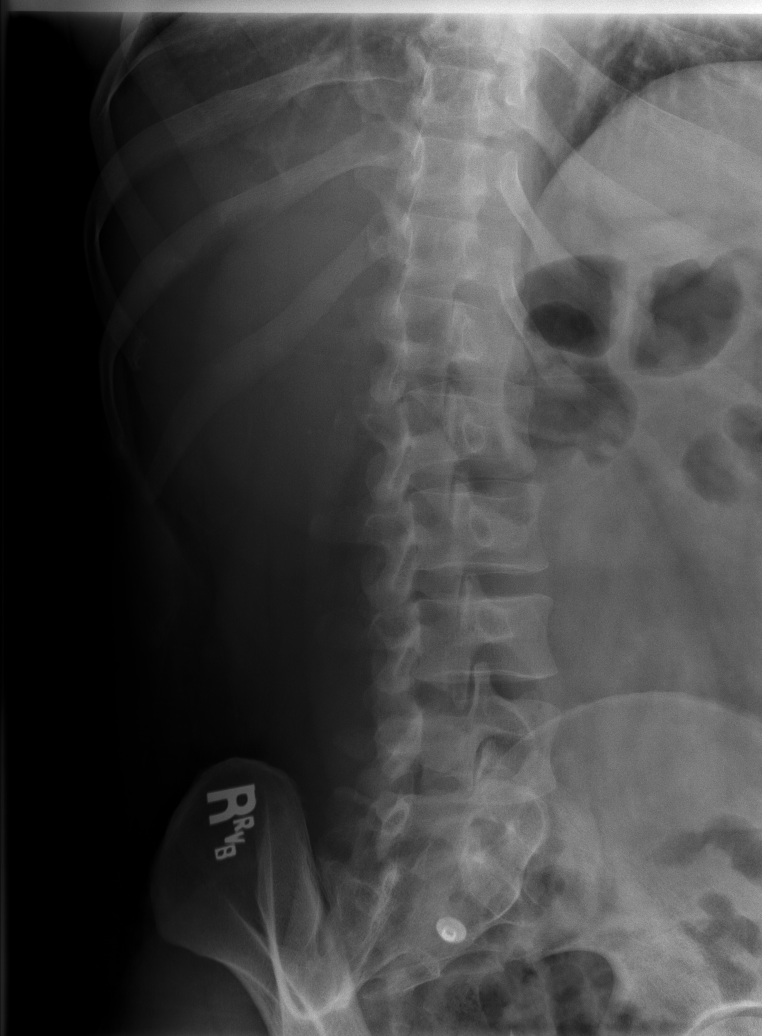

[5 of 5 positions shown; findings below may reference images not displayed]

FINDINGS: There is no evidence of lumbar spine fracture. Alignment is normal.
Intervertebral disc spaces are maintained.
IMPRESSION: Negative.

## 2023-06-02 IMAGING — US US RENAL
1 series · 14 of 25 positions shown · non-contrast
Comparison: None

CLINICAL DATA: Acute kidney injury, history hypertension, smoker

EXAM:
RENAL / URINARY TRACT ULTRASOUND COMPLETE

[Series 1: us renal · 14 of 65 slices shown]
[im 1/65]
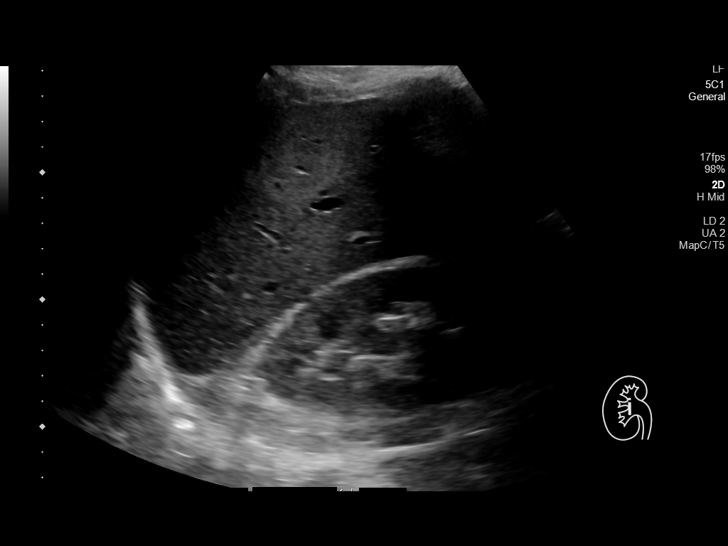
[im 6/65]
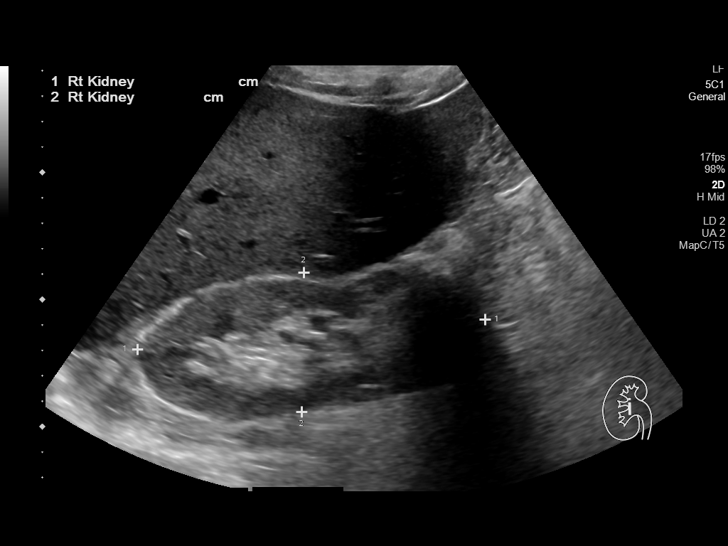
[im 11/65]
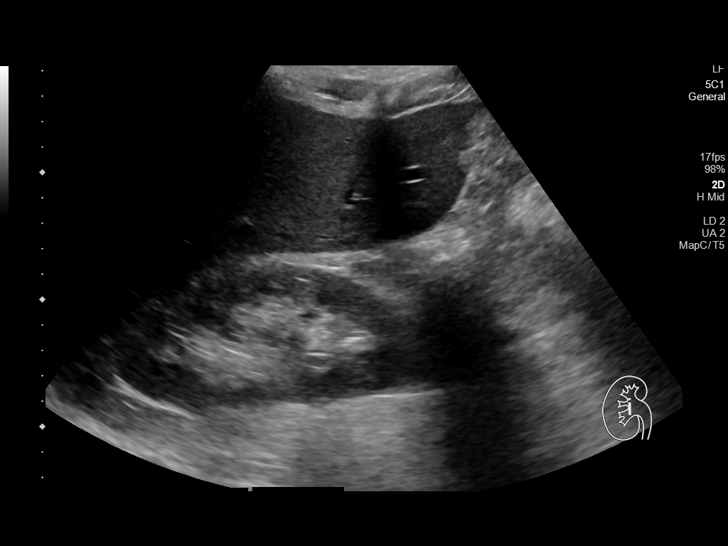
[im 17/65]
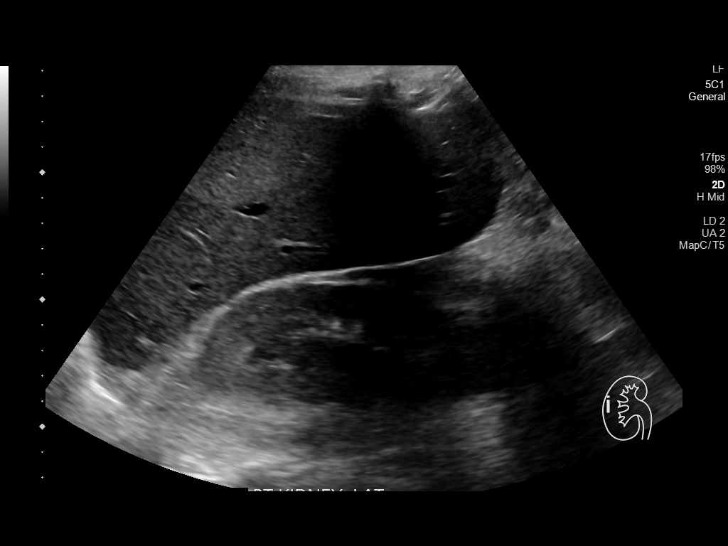
[im 22/65]
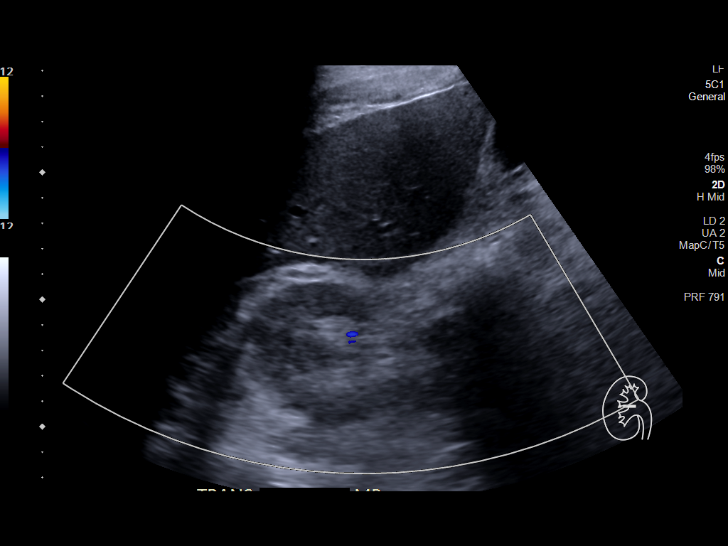
[im 25/65]
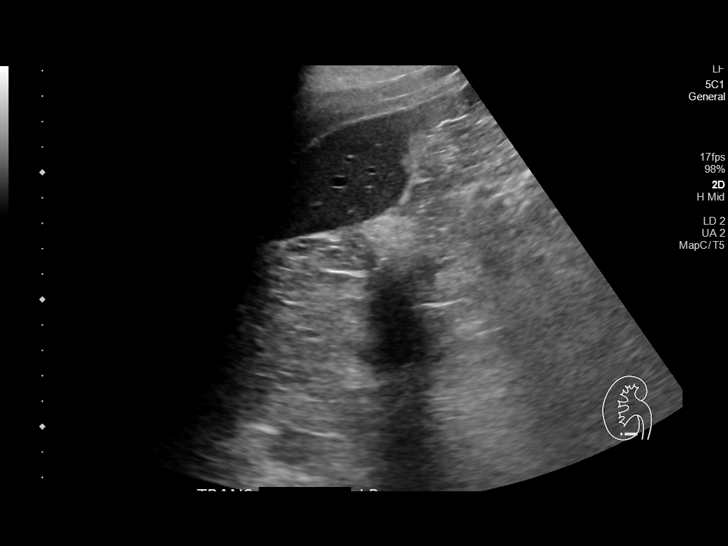
[im 30/65]
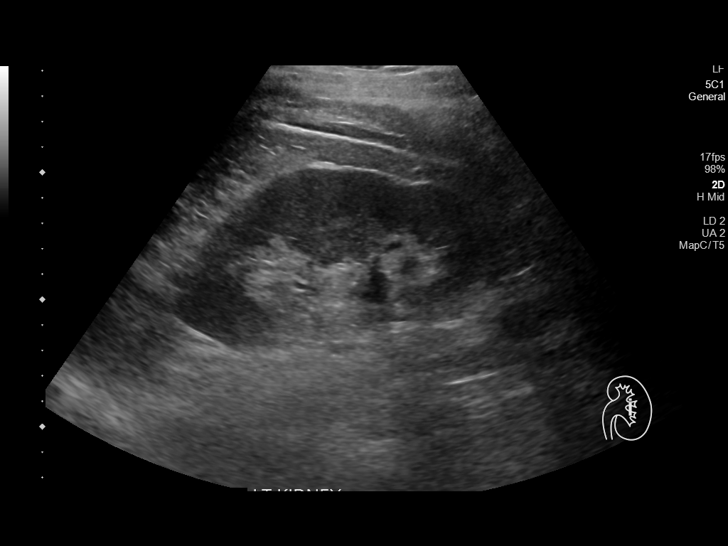
[im 35/65]
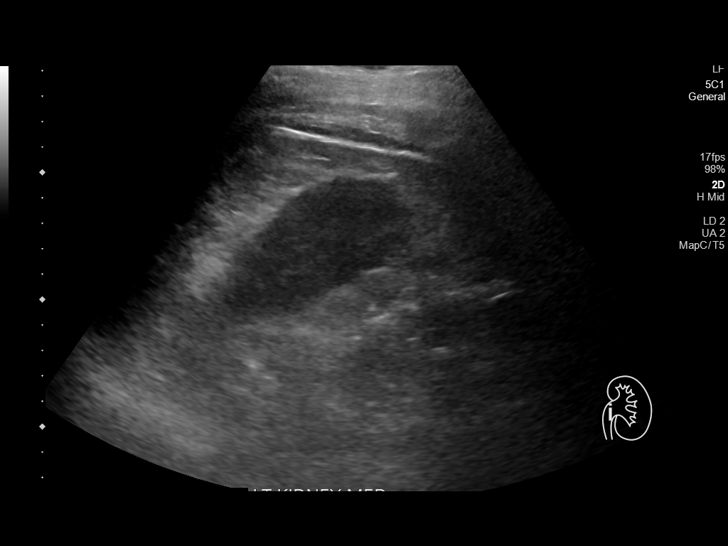
[im 41/65]
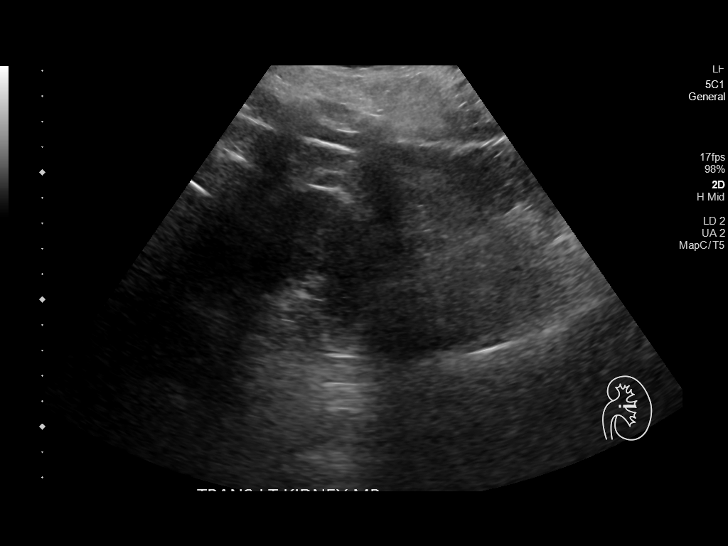
[im 43/65]
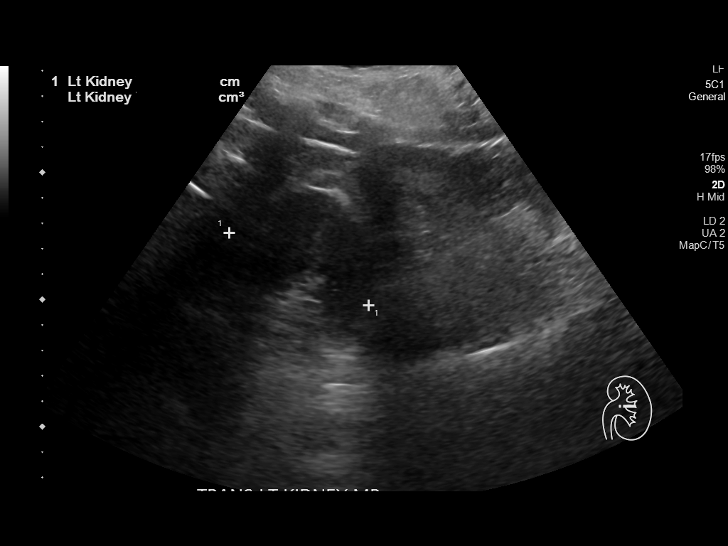
[im 49/65]
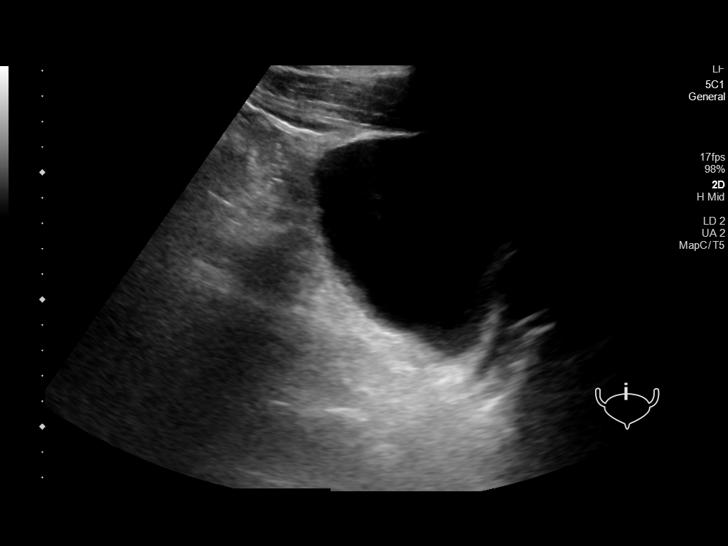
[im 54/65]
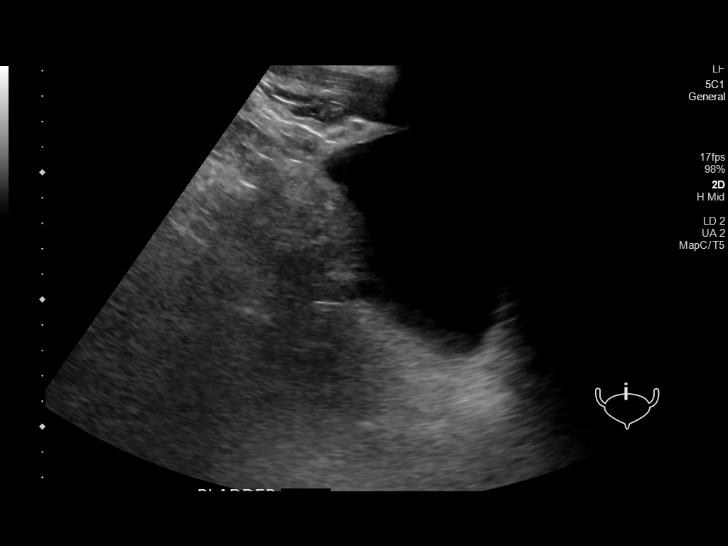
[im 59/65]
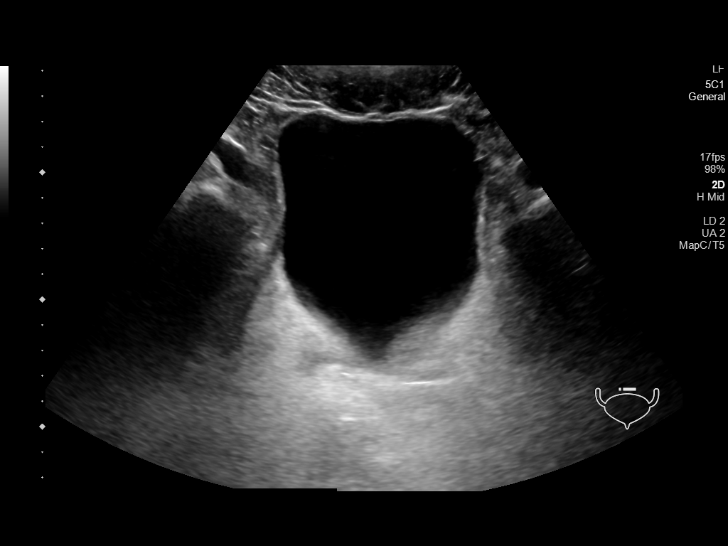
[im 65/65]
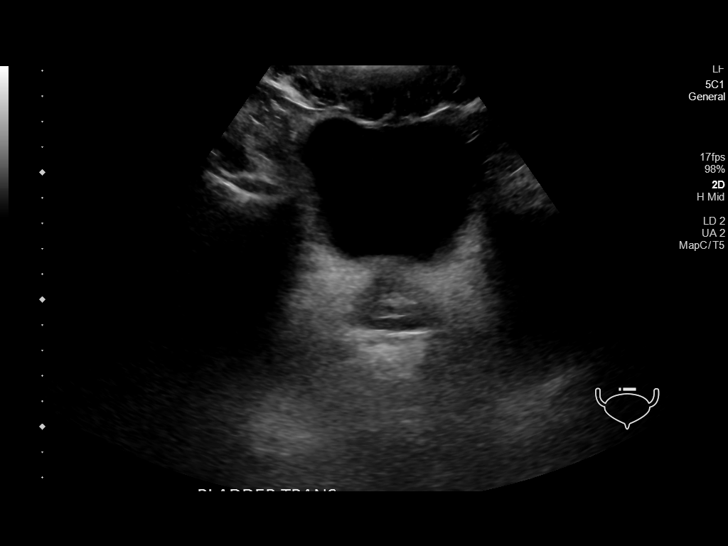

[14 of 25 positions shown; findings below may reference images not displayed]

FINDINGS: Right Kidney:

Renal measurements: 13.7 x 5.5 x 5.9 cm = volume: 230 mL. Normal
cortical thickness and echogenicity. No mass, hydronephrosis or
shadowing calcification.

Left Kidney:

Renal measurements: 13.2 x 6.7 x 6.2 cm = volume: 286 mL. Normal
cortical thickness and echogenicity. No mass, hydronephrosis or
shadowing calcification.

Bladder:

Appears normal for degree of bladder distention.

Other:

N/A
IMPRESSION: Normal exam.

## 2024-05-30 ENCOUNTER — Emergency Department (HOSPITAL_COMMUNITY)
Admission: EM | Admit: 2024-05-30 | Discharge: 2024-05-30 | Discharge status: Against Medical Advice | Payer: Self-pay | Attending: Emergency Medicine | Admitting: Emergency Medicine

## 2024-05-30 ENCOUNTER — Emergency Department (HOSPITAL_COMMUNITY): Payer: Self-pay

## 2024-05-30 ENCOUNTER — Encounter (HOSPITAL_COMMUNITY): Payer: Self-pay

## 2024-05-30 DIAGNOSIS — I10 Essential (primary) hypertension: Secondary | ICD-10-CM | POA: Insufficient documentation

## 2024-05-30 DIAGNOSIS — Z79899 Other long term (current) drug therapy: Secondary | ICD-10-CM | POA: Diagnosis not present

## 2024-05-30 DIAGNOSIS — F1910 Other psychoactive substance abuse, uncomplicated: Secondary | ICD-10-CM | POA: Diagnosis not present

## 2024-05-30 DIAGNOSIS — R4182 Altered mental status, unspecified: Secondary | ICD-10-CM | POA: Diagnosis present

## 2024-05-30 LAB — I-STAT VENOUS BLOOD GAS, ED
Acid-Base Excess: 5 mmol/L — ABNORMAL HIGH (ref 0.0–2.0)
Bicarbonate: 27.9 mmol/L (ref 20.0–28.0)
Calcium, Ion: 0.96 mmol/L — ABNORMAL LOW (ref 1.15–1.40)
HCT: 38 % — ABNORMAL LOW (ref 39.0–52.0)
Hemoglobin: 12.9 g/dL — ABNORMAL LOW (ref 13.0–17.0)
O2 Saturation: 94 %
Potassium: 3.8 mmol/L (ref 3.5–5.1)
Sodium: 137 mmol/L (ref 135–145)
TCO2: 29 mmol/L (ref 22–32)
pCO2, Ven: 33.8 mmHg — ABNORMAL LOW (ref 44–60)
pH, Ven: 7.525 — ABNORMAL HIGH (ref 7.25–7.43)
pO2, Ven: 63 mmHg — ABNORMAL HIGH (ref 32–45)

## 2024-05-30 LAB — CBC
HCT: 39.7 % (ref 39.0–52.0)
Hemoglobin: 12.2 g/dL — ABNORMAL LOW (ref 13.0–17.0)
MCH: 26.8 pg (ref 26.0–34.0)
MCHC: 30.7 g/dL (ref 30.0–36.0)
MCV: 87.1 fL (ref 80.0–100.0)
Platelets: 276 K/uL (ref 150–400)
RBC: 4.56 MIL/uL (ref 4.22–5.81)
RDW: 14.1 % (ref 11.5–15.5)
WBC: 9.8 K/uL (ref 4.0–10.5)
nRBC: 0 % (ref 0.0–0.2)

## 2024-05-30 LAB — RAPID URINE DRUG SCREEN, HOSP PERFORMED
Amphetamines: NOT DETECTED
Barbiturates: NOT DETECTED
Benzodiazepines: NOT DETECTED
Cocaine: POSITIVE — AB
Opiates: NOT DETECTED
Tetrahydrocannabinol: NOT DETECTED

## 2024-05-30 LAB — COMPREHENSIVE METABOLIC PANEL WITH GFR
ALT: 17 U/L (ref 0–44)
AST: 27 U/L (ref 15–41)
Albumin: 3.5 g/dL (ref 3.5–5.0)
Alkaline Phosphatase: 63 U/L (ref 38–126)
Anion gap: 11 (ref 5–15)
BUN: 14 mg/dL (ref 6–20)
CO2: 28 mmol/L (ref 22–32)
Calcium: 8.8 mg/dL — ABNORMAL LOW (ref 8.9–10.3)
Chloride: 100 mmol/L (ref 98–111)
Creatinine, Ser: 0.97 mg/dL (ref 0.61–1.24)
GFR, Estimated: >60 mL/min (ref >60)
Glucose, Bld: 131 mg/dL — ABNORMAL HIGH (ref 70–99)
Potassium: 3.9 mmol/L (ref 3.5–5.1)
Sodium: 139 mmol/L (ref 135–145)
Total Bilirubin: 0.5 mg/dL (ref 0.0–1.2)
Total Protein: 7.1 g/dL (ref 6.5–8.1)

## 2024-05-30 LAB — CBG MONITORING, ED: Glucose-Capillary: 156 mg/dL — ABNORMAL HIGH (ref 70–99)

## 2024-05-30 LAB — SALICYLATE LEVEL: Salicylate Lvl: <7 mg/dL — ABNORMAL LOW (ref 7.0–30.0)

## 2024-05-30 LAB — ETHANOL: Alcohol, Ethyl (B): <15 mg/dL (ref <15)

## 2024-05-30 LAB — ACETAMINOPHEN LEVEL: Acetaminophen (Tylenol), Serum: <10 ug/mL — ABNORMAL LOW (ref 10–30)

## 2024-05-30 NOTE — ED Triage Notes (Signed)
 Pt BIB [facility] from home. [place] he took a friends BP pill. Passed out w/ irregular breathing. 4MG  IN Narcan  given by friend. Patient now lethargic, able to follow commands and ambulatory. 188/102 BP 87 HR 96 RA 68 capnography bradypnea.  18 G LAC 4MG  zofan given en route.

## 2024-05-30 NOTE — ED Provider Notes (Signed)
 [place] [facility]PARTMENT AT [facility] Provider Note   CSN: [id number] Arrival date & time: 05/30/24  1212     Patient presents with: Drug Overdose   Edward Osborn is a 43 y.o. male.    Drug Overdose  Patient brought in for mental status change.  Reportedly given Narcan  at home for reportedly took potentially an extra pill of some kind.  Decreased mental status not really provide any history.  Pupils constricted.  History of polysubstance abuse.    Past Medical History:  Diagnosis Date   GERD (gastroesophageal reflux disease)    Hypertension     Prior to Admission medications   Medication Sig Start Date End Date Taking? Authorizing Provider  acetaminophen  (TYLENOL ) 500 MG tablet Take 2 tablets (1,000 mg total) by mouth every 6 (six) hours as needed for mild pain. 03/19/23   Meuth, Brooke A, PA-C  amLODipine  (NORVASC ) 5 MG tablet Take 1 tablet (5 mg total) by mouth daily. 03/20/23   Vicci Burnard SAUNDERS, PA-C  docusate sodium  (COLACE) 100 MG capsule Take 1 capsule (100 mg total) by mouth 2 (two) times daily. 03/19/23   Meuth, Brooke A, PA-C  methocarbamol  (ROBAXIN ) 500 MG tablet Take 1 tablet (500 mg total) by mouth every 6 (six) hours as needed for muscle spasms. 03/20/23   Vicci Burnard SAUNDERS, PA-C  naloxone  (NARCAN ) nasal spray 4 mg/0.1 mL As needed for overdose 05/14/23   Rancour, Garnette, MD  oxyCODONE  (OXY IR/ROXICODONE ) 5 MG immediate release tablet Take 1 tablet (5 mg total) by mouth every 6 (six) hours as needed for severe pain. 03/20/23   Vicci Burnard SAUNDERS, PA-C  polyethylene glycol (MIRALAX  / GLYCOLAX ) 17 g packet Take 17 g by mouth daily as needed for mild constipation (constipation). 03/19/23   Meuth, Brooke A, PA-C  escitalopram (LEXAPRO) 20 MG tablet Take 20 mg by mouth daily.  01/26/20  [provider]    Allergies: Amoxicillin-pot clavulanate    Review of Systems  Updated Vital Signs BP (!) 153/75 (BP Location: Right Arm)   Pulse 91    Temp 98.1 F (36.7 C) (Oral)   Resp 13   SpO2 99%   Physical Exam Vitals and nursing note reviewed.  Eyes:     Comments: Pupils constricted.  Cardiovascular:     Rate and Rhythm: Regular rhythm.  Pulmonary:     Breath sounds: No wheezing or rhonchi.  Abdominal:     Tenderness: There is no abdominal tenderness.  Neurological:     Mental Status: He is alert.     Comments: Increased mental status.  Will wake some but cannot really provide much history.     (all labs ordered are listed, but only abnormal results are displayed) Labs Reviewed  COMPREHENSIVE METABOLIC PANEL WITH GFR - Abnormal; Notable for the following components:      Result Value   Glucose, Bld 131 (*)    Calcium 8.8 (*)    All other components within normal limits  CBC - Abnormal; Notable for the following components:   Hemoglobin 12.2 (*)    All other components within normal limits  RAPID URINE DRUG SCREEN, HOSP PERFORMED - Abnormal; Notable for the following components:   Cocaine POSITIVE (*)    All other components within normal limits  SALICYLATE LEVEL - Abnormal; Notable for the following components:   Salicylate Lvl <7.0 (*)    All other components within normal limits  ACETAMINOPHEN  LEVEL - Abnormal; Notable for the following components:  Acetaminophen  (Tylenol ), Serum <10 (*)    All other components within normal limits  CBG MONITORING, ED - Abnormal; Notable for the following components:   Glucose-Capillary 156 (*)    All other components within normal limits  I-STAT VENOUS BLOOD GAS, ED - Abnormal; Notable for the following components:   pH, Ven 7.525 (*)    pCO2, Ven 33.8 (*)    pO2, Ven 63 (*)    Acid-Base Excess 5.0 (*)    Calcium, Ion 0.96 (*)    HCT 38.0 (*)    Hemoglobin 12.9 (*)    All other components within normal limits  ETHANOL    EKG: EKG Interpretation Date/Time:  Thursday May 30 2024 12:22:56 EST Ventricular Rate:  89 PR Interval:  134 QRS Duration:  96 QT  Interval:  377 QTC Calculation: 459 R Axis:   72  Text Interpretation: Sinus rhythm Probable left atrial enlargement Left ventricular hypertrophy Borderline T abnormalities, lateral leads Confirmed by Patsey Lot [phone]) on 05/30/2024 1:34:15 PM  Radiology: CT Head Wo Contrast Result Date: 05/30/2024 EXAM: CT HEAD WITHOUT CONTRAST 05/30/2024 05:15:00 PM TECHNIQUE: CT of the head was performed without the administration of intravenous contrast. Automated exposure control, iterative reconstruction, and/or weight based adjustment of the mA/kV was utilized to reduce the radiation dose to as low as reasonably achievable. COMPARISON: Head CT 03/18/2023. CLINICAL HISTORY: 43 year old male with altered mental status. Rule out subdural/epidural hemorrhage. FINDINGS: BRAIN AND VENTRICLES: No acute hemorrhage. No evidence of acute infarct. No hydrocephalus. No extra-axial collection. No mass effect or midline shift. Cavum septum pellucidum and cavum vergae variant again noted. Overall cerebral volume is stable, normal for age. Chronic lacunar type infarcts of the inferior left caudate nucleus and at the left caudothalamic groove are stable. Elsewhere gray white differentiation is stable and within normal limits. No suspicious intracranial vascular hyperdensity. ORBITS: Disconjugate gaze. Otherwise negative orbit soft tissues. SINUSES: Stable mild paranasal sinus mucosal thickening. Middle ears and mastoids remain well aerated. SOFT TISSUES AND SKULL: Partially visible carious and poor maxillary dentition. No acute soft tissue abnormality. No skull fracture. IMPRESSION: 1. No acute traumatic injury or acute intracranial abnormality identified. 2. 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 Electronically signed by: Helayne Hurst MD 05/30/2024 05:20 PM EST RP Workstation: [id number]     Procedures   Medications Ordered in the ED - No data to display                                   Medical Decision Making Amount and/or Complexity of Data Reviewed Labs: ordered. Radiology: ordered.   Patient mental status change..  Pupils constricted.  History of substance use.  Differential diagnoses longed does include substance use.  Reported to have some relief with intranasal Narcan .  Will get basic blood work.  Will monitor.  At this point maintaining airway and will not give more Narcan .  Care turned over to Dr. Simon     Final diagnoses:  Polysubstance abuse [facility])    ED Discharge Orders     None          Patsey Lot, MD 05/31/24 [phone]

## 2024-05-30 NOTE — Discharge Instructions (Addendum)
 You do have a history of strokes on your CT scan. They are old. You need to follow up with your primary care physician and neurology so that we can make sure a big stroke does not happen in the future.

## 2024-05-30 NOTE — ED Provider Notes (Signed)
  Physical Exam  BP (!) 172/91 (BP Location: Right Arm)   Pulse 83   Temp 98.2 F (36.8 C) (Oral)   Resp 10   SpO2 98%   Physical Exam Vitals and nursing note reviewed.  Constitutional:      General: He is not in acute distress.    Appearance: He is well-developed.  HENT:     Head: Normocephalic and atraumatic.  Eyes:     Conjunctiva/sclera: Conjunctivae normal.  Cardiovascular:     Rate and Rhythm: Normal rate and regular rhythm.     Heart sounds: No murmur heard. Pulmonary:     Effort: Pulmonary effort is normal. No respiratory distress.     Breath sounds: Normal breath sounds.  Abdominal:     Palpations: Abdomen is soft.     Tenderness: There is no abdominal tenderness.  Musculoskeletal:        General: No swelling.     Cervical back: Neck supple.  Skin:    General: Skin is warm and dry.     Capillary Refill: Capillary refill takes less than 2 seconds.  Neurological:     Mental Status: He is alert.  Psychiatric:        Mood and Affect: Mood normal.     Procedures  Procedures  ED Course / MDM    Medical Decision Making Amount and/or Complexity of Data Reviewed Labs: ordered. Radiology: ordered.   Patient presents because of concern for mental status change and drug overdose.  Patient was given Narcan  at home for reportedly taken the pills sometime.  Decreased mental status not really providing good history.  Pupils constricted.  History of polysubstance abuse.  Lab work so far unremarkable.  Observation to see if patient metabolizes and then reassess.  Did obtain CT head to rule out trauma. This was negative for acute pathology. Did show old cva.   Patient was eating. Walking around. He eloped before I could talk to him for a third time about his old cva that needs to be followed by PCP. I tried to call with no answer. When I spoke to him earlier, he denied SI/HI. He has a history of polysubstance abuse.      Simon Lavonia SAILOR, MD 05/30/24 2020
